# Patient Record
Sex: Male | Born: 1990 | Race: Black or African American | Hispanic: No
Health system: Western US, Academic
[De-identification: ages and names within clinical notes are randomized; demographics above are authoritative.]

## PROBLEM LIST (undated history)

## (undated) ENCOUNTER — Emergency Department (HOSPITAL_COMMUNITY): Discharge status: Against Medical Advice | Payer: Self-pay

## (undated) DEATH — deceased

---
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
## 2014-04-04 ENCOUNTER — Ambulatory Visit (INDEPENDENT_AMBULATORY_CARE_PROVIDER_SITE_OTHER): Payer: Self-pay

## 2014-04-04 ENCOUNTER — Encounter: Payer: Self-pay | Admitting: Internal Medicine

## 2014-04-04 DIAGNOSIS — Z113 Encounter for screening for infections with a predominantly sexual mode of transmission: Secondary | ICD-10-CM

## 2014-04-04 DIAGNOSIS — Z79899 Other long term (current) drug therapy: Secondary | ICD-10-CM

## 2014-04-04 DIAGNOSIS — B20 Human immunodeficiency virus [HIV] disease: Secondary | ICD-10-CM

## 2014-04-04 LAB — CBC WITH DIFFERENTIAL/PLATELET
Basophils Absolute: 0 10*3/uL (ref 0.0–0.1)
Basophils Relative: 0 % (ref 0–1)
Eosinophils Absolute: 0 10*3/uL (ref 0.0–0.7)
Eosinophils Relative: 1 % (ref 0–5)
HCT: 40 % (ref 39.0–52.0)
Hemoglobin: 13.7 g/dL (ref 13.0–17.0)
Lymphocytes Relative: 32 % (ref 12–46)
Lymphs Abs: 1.2 10*3/uL (ref 0.7–4.0)
MCH: 28 pg (ref 26.0–34.0)
MCHC: 34.3 g/dL (ref 30.0–36.0)
MCV: 81.6 fL (ref 78.0–100.0)
MPV: 9.6 fL (ref 8.6–12.4)
Monocytes Absolute: 0.4 10*3/uL (ref 0.1–1.0)
Monocytes Relative: 10 % (ref 3–12)
Neutro Abs: 2.2 10*3/uL (ref 1.7–7.7)
Neutrophils Relative %: 57 % (ref 43–77)
Platelets: 193 10*3/uL (ref 150–400)
RBC: 4.9 MIL/uL (ref 4.22–5.81)
RDW: 14.8 % (ref 11.5–15.5)
WBC: 3.9 10*3/uL — ABNORMAL LOW (ref 4.0–10.5)

## 2014-04-05 LAB — COMPLETE METABOLIC PANEL WITH GFR
ALT: 9 U/L (ref 0–53)
AST: 17 U/L (ref 0–37)
Albumin: 4.1 g/dL (ref 3.5–5.2)
Alkaline Phosphatase: 68 U/L (ref 39–117)
BUN: 7 mg/dL (ref 6–23)
CO2: 31 mEq/L (ref 19–32)
Calcium: 9.3 mg/dL (ref 8.4–10.5)
Chloride: 103 mEq/L (ref 96–112)
Creat: 0.83 mg/dL (ref 0.50–1.35)
GFR, Est African American: >89 mL/min
GFR, Est Non African American: >89 mL/min
Glucose, Bld: 99 mg/dL (ref 70–99)
Potassium: 4.3 mEq/L (ref 3.5–5.3)
Sodium: 138 mEq/L (ref 135–145)
Total Bilirubin: 0.4 mg/dL (ref 0.2–1.2)
Total Protein: 8.7 g/dL — ABNORMAL HIGH (ref 6.0–8.3)

## 2014-04-05 LAB — T-HELPER CELL (CD4) - (RCID CLINIC ONLY)
CD4 % Helper T Cell: 17 % — ABNORMAL LOW (ref 33–55)
CD4 T CELL ABS: 200 /uL — AB (ref 400–2700)

## 2014-04-05 LAB — LIPID PANEL
Cholesterol: 130 mg/dL (ref 0–200)
HDL: 48 mg/dL (ref >=40)
LDL CALC: 71 mg/dL (ref 0–99)
TRIGLYCERIDES: 54 mg/dL (ref <150)
Total CHOL/HDL Ratio: 2.7 Ratio
VLDL: 11 mg/dL (ref 0–40)

## 2014-04-05 LAB — URINALYSIS
Bilirubin Urine: NEGATIVE
Glucose, UA: NEGATIVE mg/dL
Hgb urine dipstick: NEGATIVE
KETONES UR: NEGATIVE mg/dL
LEUKOCYTES UA: NEGATIVE
NITRITE: NEGATIVE
PH: 6.5 (ref 5.0–8.0)
Protein, ur: NEGATIVE mg/dL
Specific Gravity, Urine: 1.021 (ref 1.005–1.030)
UROBILINOGEN UA: 0.2 mg/dL (ref 0.0–1.0)

## 2014-04-05 LAB — URINE CYTOLOGY ANCILLARY ONLY
Chlamydia: NEGATIVE
Neisseria Gonorrhea: NEGATIVE

## 2014-04-05 LAB — HEPATITIS B SURFACE ANTIGEN: Hepatitis B Surface Ag: NEGATIVE

## 2014-04-05 LAB — HEPATITIS A ANTIBODY, TOTAL: HEP A TOTAL AB: NONREACTIVE

## 2014-04-05 LAB — HEPATITIS B CORE ANTIBODY, TOTAL: Hep B Core Total Ab: NONREACTIVE

## 2014-04-05 LAB — HEPATITIS B SURFACE ANTIBODY,QUALITATIVE: Hep B S Ab: POSITIVE — AB

## 2014-04-05 LAB — RPR

## 2014-04-05 LAB — HEPATITIS C ANTIBODY: HCV AB: NEGATIVE

## 2014-04-06 LAB — HIV-1 RNA ULTRAQUANT REFLEX TO GENTYP+
HIV 1 RNA Quant: 34191 copies/mL — ABNORMAL HIGH (ref <20)
HIV-1 RNA QUANT, LOG: 4.53 {Log} — AB (ref <1.30)

## 2014-04-06 NOTE — Progress Notes (Signed)
Patient is new today for an HIV intake, diagnosed on 03/16/2014 at [facility]. Patient expressed that he is trying very hard to "wrap his head around his diagnosis". He states that physically he feels fine, but emotionally he is sad and has a lot of worries about the future. I advised him that we have counseling here, and he could start when he felt up to it. He is in a relationship of 2 years and his partner is negative. Patient refused vaccines today, but states he may get them when he returns in 2 weeks. Next appointment is on 04/19/2014 Pearson Grippeamika [place]arborough,CMA, BS

## 2014-04-07 LAB — QUANTIFERON TB GOLD ASSAY (BLOOD)
Interferon Gamma Release Assay: NEGATIVE
MITOGEN VALUE: 0.85 [IU]/mL
QUANTIFERON NIL VALUE: 0.06 [IU]/mL
Quantiferon Tb Ag Minus Nil Value: 0 IU/mL
TB Ag value: 0.05 IU/mL

## 2014-04-09 LAB — HLA B*5701: HLA-B 5701 W/RFLX HLA-B HIGH: NEGATIVE

## 2014-04-15 LAB — HIV-1 GENOTYPR PLUS

## 2014-04-19 ENCOUNTER — Ambulatory Visit (INDEPENDENT_AMBULATORY_CARE_PROVIDER_SITE_OTHER): Payer: Self-pay | Admitting: Internal Medicine

## 2014-04-19 ENCOUNTER — Encounter: Payer: Self-pay | Admitting: Internal Medicine

## 2014-04-19 VITALS — BP 124/78 | HR 79 | Temp 98.8°F | Ht 71.0 in | Wt 158.0 lb

## 2014-04-19 DIAGNOSIS — B20 Human immunodeficiency virus [HIV] disease: Secondary | ICD-10-CM | POA: Insufficient documentation

## 2014-04-19 DIAGNOSIS — Z23 Encounter for immunization: Secondary | ICD-10-CM

## 2014-04-19 MED ORDER — ELVITEG-COBIC-EMTRICIT-TENOFAF 150-150-200-10 MG PO TABS: 1.0000 | ORAL_TABLET | Freq: Every day | ORAL | Status: DC

## 2014-04-19 NOTE — Addendum Note (Signed)
Addended by: Wendall MolaOCKERHAM, JACQUELINE A on: 04/19/2014 05:00 PM   Modules accepted: Orders

## 2014-04-19 NOTE — Progress Notes (Signed)
Patient ID: Ronald Gaines, male    DOB: 11/28/1990, 24 y.o.   MRN: [id number]  HPI:   He comes in for his first visit for HIV.  Diagnosed at [facility].  Has been with same partner for 2 years.  Adjusting to diagnosis.    History reviewed. No pertinent past medical history.  Prior to Admission medications   Not on File    No Known Allergies  History  Substance Use Topics  . Smoking status: Never Smoker   . Smokeless tobacco: Not on file  . Alcohol Use: 0.0 oz/week    0 Standard drinks or equivalent per week     Comment: socially    History reviewed. No pertinent family history.  Review of Systems A comprehensive review of systems was negative.   There were no vitals filed for this visit. in no apparent distress and alert HEENT: anicteric Cor RRR and No murmurs clear Bowel sounds are normal, liver is not enlarged, spleen is not enlarged peripheral pulses normal, no pedal edema, no clubbing or cyanosis negative for - jaundice, spider hemangioma, telangiectasia, palmar erythema, ecchymosis and atrophy  Lab Results  Component Value Date   HIV1RNAQUANT 34191* 04/04/2014   No components found for: HIV1GENOTYPRPLUS No components found for: THELPERCELL  Assessment: new HIV patient.  Discussed treatment options, long term benefits, mechanism of resistance.  Need to take consistently.    Plan: 1) Start Genvoya once established with drug assistance.  2) Follow up in 4-5 weeks on treatment 3) offered counseling but patient refused

## 2014-04-20 ENCOUNTER — Other Ambulatory Visit: Payer: Self-pay | Admitting: Licensed Clinical Social Worker

## 2014-04-20 MED ORDER — ELVITEG-COBIC-EMTRICIT-TENOFAF 150-150-200-10 MG PO TABS
1.0000 | ORAL_TABLET | Freq: Every day | ORAL | Status: DC
Start: 2014-04-20 — End: 2014-05-04

## 2014-05-04 MED ORDER — ELVITEG-COBIC-EMTRICIT-TENOFAF 150-150-200-10 MG PO TABS: 1.0000 | ORAL_TABLET | Freq: Every day | ORAL | Status: DC

## 2014-05-12 ENCOUNTER — Telehealth: Payer: Self-pay | Admitting: Licensed Clinical Social Worker

## 2014-05-12 NOTE — Telephone Encounter (Signed)
Patient had appointments scheduled for May but did not start medications yet. I pushed the lab visit and office visit back to mid June. Patient will call back when he start medications.

## 2014-05-17 ENCOUNTER — Other Ambulatory Visit: Payer: Self-pay

## 2014-06-02 ENCOUNTER — Ambulatory Visit: Payer: Self-pay | Admitting: Internal Medicine

## 2014-06-20 ENCOUNTER — Telehealth: Payer: Self-pay | Admitting: *Deleted

## 2014-06-20 ENCOUNTER — Encounter: Payer: Self-pay | Admitting: *Deleted

## 2014-06-20 NOTE — Telephone Encounter (Signed)
Needing to drop off FMLA paperwork to be completed by MD.  Pt requested if he could "[facility] off" the [facility].  RN left message for the pt that "yes" he could "drop off" the paperwork.  He would need to allow 7 working days for the MD to complete it.

## 2014-06-27 ENCOUNTER — Other Ambulatory Visit: Payer: Self-pay

## 2014-06-27 ENCOUNTER — Telehealth: Payer: Self-pay | Admitting: Licensed Clinical Social Worker

## 2014-06-27 NOTE — Telephone Encounter (Signed)
Patient called because he has not received a letter that he was approved for ADAP, I called [facility] and it is approved. Notified the patient that he can go today to get his medication. Also I cancelled his lab appointment for today and his office visit for 2 weeks. Patient will call back to let us know what day he started medications so we can reschedule labs 4 weeks after and office visit 2 weeks later.

## 2014-07-07 ENCOUNTER — Ambulatory Visit: Payer: Self-pay | Admitting: Internal Medicine

## 2014-07-19 ENCOUNTER — Telehealth: Payer: Self-pay | Admitting: *Deleted

## 2014-07-19 NOTE — Telephone Encounter (Signed)
Notified patient that per Dr. Luciana Axeomer he can not fill out the  Physical assessment form from [employer]. He needs to give to PCP and if he does not have one we can provide some names. He does have insurance. He said he needs the form to be able to come to appts; but the form he left is not FMLA it is a physical assessment. If he wants FMLA filled out he will need an appt with Dr. Luciana Axeomer. Wendall MolaJacqueline Cockerham

## 2014-07-19 NOTE — Telephone Encounter (Signed)
Paperwork up front in folder to return to patient.

## 2014-08-16 ENCOUNTER — Other Ambulatory Visit: Payer: Self-pay

## 2014-09-05 ENCOUNTER — Other Ambulatory Visit (INDEPENDENT_AMBULATORY_CARE_PROVIDER_SITE_OTHER): Payer: Self-pay

## 2014-09-05 DIAGNOSIS — B20 Human immunodeficiency virus [HIV] disease: Secondary | ICD-10-CM

## 2014-09-05 LAB — CBC WITH DIFFERENTIAL/PLATELET
BASOS ABS: 0 10*3/uL (ref 0.0–0.1)
BASOS PCT: 0 % (ref 0–1)
Eosinophils Absolute: 0 10*3/uL (ref 0.0–0.7)
Eosinophils Relative: 1 % (ref 0–5)
HCT: 41.3 % (ref 39.0–52.0)
Hemoglobin: 14.4 g/dL (ref 13.0–17.0)
Lymphocytes Relative: 46 % (ref 12–46)
Lymphs Abs: 1.5 10*3/uL (ref 0.7–4.0)
MCH: 28.2 pg (ref 26.0–34.0)
MCHC: 34.9 g/dL (ref 30.0–36.0)
MCV: 80.8 fL (ref 78.0–100.0)
MONOS PCT: 14 % — AB (ref 3–12)
MPV: 10.3 fL (ref 8.6–12.4)
Monocytes Absolute: 0.5 10*3/uL (ref 0.1–1.0)
NEUTROS PCT: 39 % — AB (ref 43–77)
Neutro Abs: 1.3 10*3/uL — ABNORMAL LOW (ref 1.7–7.7)
Platelets: 157 10*3/uL (ref 150–400)
RBC: 5.11 MIL/uL (ref 4.22–5.81)
RDW: 15.6 % — ABNORMAL HIGH (ref 11.5–15.5)
WBC: 3.3 10*3/uL — ABNORMAL LOW (ref 4.0–10.5)

## 2014-09-05 LAB — COMPLETE METABOLIC PANEL WITH GFR
ALT: 9 U/L (ref 9–46)
AST: 14 U/L (ref 10–40)
Albumin: 3.9 g/dL (ref 3.6–5.1)
Alkaline Phosphatase: 66 U/L (ref 40–115)
BUN: 10 mg/dL (ref 7–25)
CALCIUM: 9 mg/dL (ref 8.6–10.3)
CHLORIDE: 101 mmol/L (ref 98–110)
CO2: 26 mmol/L (ref 20–31)
CREATININE: 0.98 mg/dL (ref 0.60–1.35)
Glucose, Bld: 101 mg/dL — ABNORMAL HIGH (ref 65–99)
POTASSIUM: 4 mmol/L (ref 3.5–5.3)
Sodium: 139 mmol/L (ref 135–146)
Total Bilirubin: 0.8 mg/dL (ref 0.2–1.2)
Total Protein: 8.5 g/dL — ABNORMAL HIGH (ref 6.1–8.1)

## 2014-09-06 LAB — HIV-1 RNA QUANT-NO REFLEX-BLD
HIV 1 RNA Quant: <20 copies/mL (ref <20)
HIV-1 RNA Quant, Log: <1.3 {Log} (ref <1.30)

## 2014-09-06 LAB — T-HELPER CELL (CD4) - (RCID CLINIC ONLY)
CD4 % Helper T Cell: 17 % — ABNORMAL LOW (ref 33–55)
CD4 T Cell Abs: 250 /uL — ABNORMAL LOW (ref 400–2700)

## 2014-09-22 ENCOUNTER — Ambulatory Visit (INDEPENDENT_AMBULATORY_CARE_PROVIDER_SITE_OTHER): Payer: Self-pay | Admitting: Internal Medicine

## 2014-09-22 ENCOUNTER — Encounter: Payer: Self-pay | Admitting: Internal Medicine

## 2014-09-22 VITALS — BP 130/70 | HR 74 | Temp 98.1°F | Wt 166.0 lb

## 2014-09-22 DIAGNOSIS — F329 Major depressive disorder, single episode, unspecified: Secondary | ICD-10-CM

## 2014-09-22 DIAGNOSIS — B20 Human immunodeficiency virus [HIV] disease: Secondary | ICD-10-CM

## 2014-09-22 DIAGNOSIS — F32A Depression, unspecified: Secondary | ICD-10-CM

## 2014-09-22 DIAGNOSIS — Z23 Encounter for immunization: Secondary | ICD-10-CM

## 2014-09-22 DIAGNOSIS — Z113 Encounter for screening for infections with a predominantly sexual mode of transmission: Secondary | ICD-10-CM | POA: Insufficient documentation

## 2014-09-22 NOTE — Assessment & Plan Note (Signed)
No si, doing ok and doesn't want any counseling.  I again encouraged it.  He is willing to see counselor from [employer].

## 2014-09-22 NOTE — Progress Notes (Signed)
   Subjective:    Patient ID: Ronald Gaines, male    DOB: [birth year], 24 y.o.   MRN: [id number]  HPI Here for follow up of HIV.  Diagnosed earlier this year and after some delay in getting the paperwork started [place].  Takes daily and no missed doses, though does take at different times of day.  No side effects.  Still adjusting to diagnosis and no support.  Not interested in counseling.  Uses condoms.     Review of Systems  Constitutional: Negative for fatigue.  Gastrointestinal: Negative for nausea and diarrhea.  Skin: Negative for rash.  Neurological: Negative for dizziness and light-headedness.       Objective:   Physical Exam  Constitutional: He appears well-developed and well-nourished. No distress.  Eyes: No scleral icterus.  Cardiovascular: Normal rate, regular rhythm and normal heart sounds.   No murmur heard. Pulmonary/Chest: Effort normal and breath sounds normal. No respiratory distress.  Lymphadenopathy:    He has no cervical adenopathy.  Skin: No rash noted.          Assessment & Plan:

## 2014-09-22 NOTE — Assessment & Plan Note (Signed)
Doing well.  rtc 3 months and then can space out fu.

## 2014-10-11 ENCOUNTER — Other Ambulatory Visit: Payer: Self-pay | Admitting: Infectious Disease

## 2014-10-11 DIAGNOSIS — B20 Human immunodeficiency virus [HIV] disease: Secondary | ICD-10-CM

## 2014-10-11 MED ORDER — ELVITEG-COBIC-EMTRICIT-TENOFAF 150-150-200-10 MG PO TABS: 1.0000 | ORAL_TABLET | Freq: Every day | ORAL | Status: DC

## 2014-12-05 ENCOUNTER — Other Ambulatory Visit (INDEPENDENT_AMBULATORY_CARE_PROVIDER_SITE_OTHER): Payer: Self-pay

## 2014-12-05 DIAGNOSIS — B20 Human immunodeficiency virus [HIV] disease: Secondary | ICD-10-CM

## 2014-12-05 DIAGNOSIS — Z113 Encounter for screening for infections with a predominantly sexual mode of transmission: Secondary | ICD-10-CM

## 2014-12-05 LAB — RPR

## 2014-12-06 LAB — T-HELPER CELL (CD4) - (RCID CLINIC ONLY)
CD4 % Helper T Cell: 18 % — ABNORMAL LOW (ref 33–55)
CD4 T Cell Abs: 280 /uL — ABNORMAL LOW (ref 400–2700)

## 2014-12-07 LAB — HIV-1 RNA QUANT-NO REFLEX-BLD: HIV-1 RNA Quant, Log: <1.3 Log copies/mL (ref <1.30)

## 2014-12-22 ENCOUNTER — Ambulatory Visit: Payer: Self-pay | Admitting: Internal Medicine

## 2015-03-16 ENCOUNTER — Ambulatory Visit: Payer: Self-pay | Admitting: Internal Medicine

## 2015-03-16 ENCOUNTER — Other Ambulatory Visit: Payer: Self-pay | Admitting: *Deleted

## 2015-03-16 DIAGNOSIS — B20 Human immunodeficiency virus [HIV] disease: Secondary | ICD-10-CM

## 2015-03-22 ENCOUNTER — Other Ambulatory Visit: Payer: Self-pay

## 2015-05-02 ENCOUNTER — Other Ambulatory Visit: Payer: Self-pay | Admitting: *Deleted

## 2015-05-02 DIAGNOSIS — B20 Human immunodeficiency virus [HIV] disease: Secondary | ICD-10-CM

## 2015-05-02 MED ORDER — ELVITEG-COBIC-EMTRICIT-TENOFAF 150-150-200-10 MG PO TABS: 1.0000 | ORAL_TABLET | Freq: Every day | ORAL | Status: DC

## 2015-05-03 ENCOUNTER — Telehealth: Payer: Self-pay | Admitting: Internal Medicine

## 2015-05-03 NOTE — Telephone Encounter (Signed)
Patient left a vm on medication assistance line on Tuesday (4/18) checking the status of his genvoya refill. Looks like it was sent to the pharmacy yesterday. Adap has been approved. Called patient to be sure he was able to get his medication, patient's vm has not been set up so could not leave a message.

## 2015-05-08 ENCOUNTER — Other Ambulatory Visit: Payer: Self-pay | Admitting: *Deleted

## 2015-05-08 DIAGNOSIS — B20 Human immunodeficiency virus [HIV] disease: Secondary | ICD-10-CM

## 2015-05-08 MED ORDER — ELVITEG-COBIC-EMTRICIT-TENOFAF 150-150-200-10 MG PO TABS: 1.0000 | ORAL_TABLET | Freq: Every day | ORAL | Status: DC

## 2015-05-16 ENCOUNTER — Ambulatory Visit: Payer: Self-pay | Admitting: Internal Medicine

## 2015-05-22 ENCOUNTER — Encounter: Payer: Self-pay | Admitting: Internal Medicine

## 2015-05-22 ENCOUNTER — Ambulatory Visit (INDEPENDENT_AMBULATORY_CARE_PROVIDER_SITE_OTHER): Payer: Self-pay | Admitting: Internal Medicine

## 2015-05-22 VITALS — BP 124/74 | HR 79 | Temp 98.3°F | Ht 71.0 in | Wt 160.0 lb

## 2015-05-22 DIAGNOSIS — Z113 Encounter for screening for infections with a predominantly sexual mode of transmission: Secondary | ICD-10-CM

## 2015-05-22 DIAGNOSIS — F329 Major depressive disorder, single episode, unspecified: Secondary | ICD-10-CM

## 2015-05-22 DIAGNOSIS — Z79899 Other long term (current) drug therapy: Secondary | ICD-10-CM | POA: Insufficient documentation

## 2015-05-22 DIAGNOSIS — F32A Depression, unspecified: Secondary | ICD-10-CM

## 2015-05-22 DIAGNOSIS — B20 Human immunodeficiency virus [HIV] disease: Secondary | ICD-10-CM

## 2015-05-22 LAB — CBC WITH DIFFERENTIAL/PLATELET
BASOS ABS: 0 {cells}/uL (ref 0–200)
Basophils Relative: 0 %
EOS PCT: 2 %
Eosinophils Absolute: 70 cells/uL (ref 15–500)
HCT: 39.1 % (ref 38.5–50.0)
HEMOGLOBIN: 13.2 g/dL (ref 13.2–17.1)
LYMPHS ABS: 1645 {cells}/uL (ref 850–3900)
Lymphocytes Relative: 47 %
MCH: 28.8 pg (ref 27.0–33.0)
MCHC: 33.8 g/dL (ref 32.0–36.0)
MCV: 85.4 fL (ref 80.0–100.0)
MPV: 9.8 fL (ref 7.5–12.5)
Monocytes Absolute: 420 cells/uL (ref 200–950)
Monocytes Relative: 12 %
NEUTROS ABS: 1365 {cells}/uL — AB (ref 1500–7800)
Neutrophils Relative %: 39 %
Platelets: 191 10*3/uL (ref 140–400)
RBC: 4.58 MIL/uL (ref 4.20–5.80)
RDW: 15.2 % — ABNORMAL HIGH (ref 11.0–15.0)
WBC: 3.5 10*3/uL — ABNORMAL LOW (ref 3.8–10.8)

## 2015-05-22 NOTE — Progress Notes (Signed)
   Subjective:    Patient ID: Ronald Gaines, male    DOB: 01/07/1991, 25 y.o.   MRN: [id number]  HPI Here for follow up of HIV.   Diagnosed last year and after some delay in getting the paperwork started Genvoya.  Takes daily and no missed doses. Had labs about 6 months ago and was undetectable again.  No side effects.  Still adjusting to diagnosis and no support. Uses condoms.  No weight loss, no diarrhea.     Review of Systems  Constitutional: Negative for fatigue.  Gastrointestinal: Negative for nausea and diarrhea.  Skin: Negative for rash.  Neurological: Negative for dizziness and light-headedness.       Objective:   Physical Exam  Constitutional: He appears well-developed and well-nourished. No distress.  Eyes: No scleral icterus.  Cardiovascular: Normal rate, regular rhythm and normal heart sounds.   No murmur heard. Pulmonary/Chest: Effort normal and breath sounds normal. No respiratory distress.  Lymphadenopathy:    He has no cervical adenopathy.  Skin: No rash noted.          Assessment & Plan:

## 2015-05-22 NOTE — Assessment & Plan Note (Signed)
Doing well.  Labs today and rtc 3 months for visit, labs and ADAP renewal in August since he comes from [place].

## 2015-05-22 NOTE — Assessment & Plan Note (Signed)
Is stable.  No SI and adjusting to diagnosis.

## 2015-05-23 LAB — COMPLETE METABOLIC PANEL WITH GFR
ALBUMIN: 3.9 g/dL (ref 3.6–5.1)
ALK PHOS: 84 U/L (ref 40–115)
ALT: 12 U/L (ref 9–46)
AST: 15 U/L (ref 10–40)
BUN: 11 mg/dL (ref 7–25)
CO2: 28 mmol/L (ref 20–31)
Calcium: 9.2 mg/dL (ref 8.6–10.3)
Chloride: 105 mmol/L (ref 98–110)
Creat: 1.11 mg/dL (ref 0.60–1.35)
GFR, Est African American: >89 mL/min (ref >=60)
GLUCOSE: 76 mg/dL (ref 65–99)
Potassium: 4.6 mmol/L (ref 3.5–5.3)
SODIUM: 140 mmol/L (ref 135–146)
Total Bilirubin: 0.3 mg/dL (ref 0.2–1.2)
Total Protein: 7.6 g/dL (ref 6.1–8.1)

## 2015-05-23 LAB — LIPID PANEL
CHOL/HDL RATIO: 2.2 ratio (ref <=5.0)
CHOLESTEROL: 139 mg/dL (ref 125–200)
HDL: 64 mg/dL (ref >=40)
LDL Cholesterol: 57 mg/dL (ref <130)
TRIGLYCERIDES: 89 mg/dL (ref <150)
VLDL: 18 mg/dL (ref <30)

## 2015-05-23 LAB — HIV-1 RNA QUANT-NO REFLEX-BLD
HIV 1 RNA QUANT: 53 {copies}/mL — AB (ref <20)
HIV-1 RNA QUANT, LOG: 1.72 {Log_copies}/mL — AB (ref <1.30)

## 2015-05-23 LAB — RPR

## 2015-05-24 LAB — URINE CYTOLOGY ANCILLARY ONLY
CHLAMYDIA, DNA PROBE: NEGATIVE
NEISSERIA GONORRHEA: NEGATIVE

## 2015-05-24 LAB — T-HELPER CELL (CD4) - (RCID CLINIC ONLY)
CD4 % Helper T Cell: 20 % — ABNORMAL LOW (ref 33–55)
CD4 T Cell Abs: 350 /uL — ABNORMAL LOW (ref 400–2700)

## 2015-06-28 ENCOUNTER — Other Ambulatory Visit: Payer: Self-pay | Admitting: Internal Medicine

## 2015-06-28 DIAGNOSIS — B20 Human immunodeficiency virus [HIV] disease: Secondary | ICD-10-CM

## 2015-08-29 ENCOUNTER — Other Ambulatory Visit (INDEPENDENT_AMBULATORY_CARE_PROVIDER_SITE_OTHER): Payer: Self-pay

## 2015-08-29 ENCOUNTER — Ambulatory Visit: Payer: Self-pay | Admitting: Internal Medicine

## 2015-08-29 ENCOUNTER — Ambulatory Visit: Payer: Self-pay

## 2015-08-29 DIAGNOSIS — B20 Human immunodeficiency virus [HIV] disease: Secondary | ICD-10-CM

## 2015-08-30 LAB — T-HELPER CELL (CD4) - (RCID CLINIC ONLY)
CD4 % Helper T Cell: 21 % — ABNORMAL LOW (ref 33–55)
CD4 T CELL ABS: 290 /uL — AB (ref 400–2700)

## 2015-08-31 LAB — HIV-1 RNA QUANT-NO REFLEX-BLD
HIV 1 RNA Quant: <20 copies/mL (ref <20)
HIV-1 RNA Quant, Log: <1.3 Log copies/mL (ref <1.30)

## 2015-09-04 ENCOUNTER — Ambulatory Visit: Payer: Self-pay | Admitting: Internal Medicine

## 2015-09-06 ENCOUNTER — Encounter: Payer: Self-pay | Admitting: Internal Medicine

## 2015-09-20 ENCOUNTER — Ambulatory Visit (INDEPENDENT_AMBULATORY_CARE_PROVIDER_SITE_OTHER): Payer: Self-pay | Admitting: Internal Medicine

## 2015-09-20 ENCOUNTER — Encounter: Payer: Self-pay | Admitting: Internal Medicine

## 2015-09-20 VITALS — BP 113/68 | HR 90 | Temp 99.4°F | Wt 162.0 lb

## 2015-09-20 DIAGNOSIS — F329 Major depressive disorder, single episode, unspecified: Secondary | ICD-10-CM

## 2015-09-20 DIAGNOSIS — B20 Human immunodeficiency virus [HIV] disease: Secondary | ICD-10-CM

## 2015-09-20 DIAGNOSIS — Z113 Encounter for screening for infections with a predominantly sexual mode of transmission: Secondary | ICD-10-CM

## 2015-09-20 DIAGNOSIS — Z79899 Other long term (current) drug therapy: Secondary | ICD-10-CM

## 2015-09-20 DIAGNOSIS — F32A Depression, unspecified: Secondary | ICD-10-CM

## 2015-09-20 NOTE — Assessment & Plan Note (Signed)
Will recheck rpr next visit.  Was 1:64 and treated.  Has condoms and has only had recent oral sex though I explained transmisison can come from multiple different encounters.

## 2015-09-20 NOTE — Assessment & Plan Note (Signed)
Still depressed but does not want any help.  No SI.

## 2015-09-20 NOTE — Progress Notes (Signed)
   Subjective:    Patient ID: Ronald Gaines, male    DOB: 09/18/1990, 25 y.o.   MRN: [id number]  HPI Here for follow up of HIV.   Has been on Genvoya since diagnosis and no issues with it.  Takes daily and no missed doses. Has been suppressed for about 1 year and CD 4 290.  No side effects. Uses condoms but recently diagnosed with syphilis.  Record from UC in [place] shows RPR 1:64.  He received IM penicillin then.  Previously negative in May 2017.  No weight loss, no diarrhea.     Review of Systems  Constitutional: Negative for fatigue.  Gastrointestinal: Negative for diarrhea and nausea.  Skin: Negative for rash.  Neurological: Negative for dizziness and light-headedness.       Objective:   Physical Exam  Constitutional: He appears well-developed and well-nourished. No distress.  Eyes: No scleral icterus.  Cardiovascular: Normal rate, regular rhythm and normal heart sounds.   No murmur heard. Pulmonary/Chest: Effort normal and breath sounds normal. No respiratory distress.  Lymphadenopathy:    He has no cervical adenopathy.  Skin: No rash noted.    SHx: recent oral sex, unprotected.        Assessment & Plan:

## 2015-09-20 NOTE — Assessment & Plan Note (Signed)
Doing well on his regimen and will continue.  RTC 6 months.  Requesting I fill out FMLA-like paper from his employer and will have it sent to me.

## 2015-09-27 ENCOUNTER — Telehealth: Payer: Self-pay | Admitting: *Deleted

## 2015-09-27 NOTE — Telephone Encounter (Signed)
Patient asking for paperwork regarding work accommodations that was discussed 9/6 office visit.  No letter in chart. Please advise.  Andree CossHowell, Michelle M, RN

## 2015-09-28 ENCOUNTER — Encounter: Payer: Self-pay | Admitting: *Deleted

## 2015-09-28 ENCOUNTER — Telehealth: Payer: Self-pay | Admitting: *Deleted

## 2015-09-28 NOTE — Telephone Encounter (Signed)
FMLA faxed to [employer], attn Gearldine BienenstockBennie Wilkins at [phone]. Patient notified and copy placed upfront for scan. Wendall MolaJacqueline Cockerham

## 2015-09-28 NOTE — Telephone Encounter (Signed)
Patient notified and he has spoke to his employer and the form is suppose to be faxed. Wendall MolaJacqueline Cockerham

## 2015-09-28 NOTE — Telephone Encounter (Signed)
Forms received in Triage today, placed in Dr. Ephriam Knucklesomer's pod in-basket.

## 2015-09-28 NOTE — Telephone Encounter (Signed)
I have not yet received any paperwork from the employer.  I will be happy to fill it out once received. thanks

## 2015-10-24 ENCOUNTER — Other Ambulatory Visit: Payer: Self-pay | Admitting: Internal Medicine

## 2015-10-24 DIAGNOSIS — B20 Human immunodeficiency virus [HIV] disease: Secondary | ICD-10-CM

## 2015-11-08 ENCOUNTER — Telehealth: Payer: Self-pay | Admitting: *Deleted

## 2015-11-08 NOTE — Telephone Encounter (Signed)
Received signed Release of Information from [facility] asking for last 2 years of HIV records. RN contacted patient to confirm that he is transferring all of his care, including HIV. Per patient, he is.  Will notify [facility], will cancel upcoming appointments. Patient's records faxed as requested. Andree CossHowell, Michelle M, RN

## 2016-01-30 ENCOUNTER — Ambulatory Visit: Payer: BLUE CROSS/BLUE SHIELD

## 2016-01-30 ENCOUNTER — Telehealth (HOSPITAL_BASED_OUTPATIENT_CLINIC_OR_DEPARTMENT_OTHER): Payer: Self-pay

## 2016-01-30 DIAGNOSIS — A549 Gonococcal infection, unspecified: Secondary | ICD-10-CM

## 2016-01-30 NOTE — Telephone Encounter (Signed)
(  TEXTING IS AN OPTION FOR [facility] CLINICS ONLY)  Is this a [facility] clinic? No      RETURN CALL: Detailed message on voicemail only      SUBJECT:  General Message     REASON FOR REQUEST: patient calling to enroll in the [facility] if available.  Patient just moved here and would like to make a post exposure appointment please call  Thank you    MESSAGE: see above

## 2016-02-08 ENCOUNTER — Encounter (HOSPITAL_BASED_OUTPATIENT_CLINIC_OR_DEPARTMENT_OTHER): Payer: Self-pay | Admitting: Counselor

## 2016-02-08 ENCOUNTER — Ambulatory Visit (HOSPITAL_BASED_OUTPATIENT_CLINIC_OR_DEPARTMENT_OTHER): Payer: BLUE CROSS/BLUE SHIELD | Attending: Internal Medicine | Admitting: Internal Medicine

## 2016-02-08 DIAGNOSIS — Z6822 Body mass index (BMI) 22.0-22.9, adult: Secondary | ICD-10-CM

## 2016-02-08 DIAGNOSIS — Z23 Encounter for immunization: Secondary | ICD-10-CM | POA: Insufficient documentation

## 2016-02-08 DIAGNOSIS — J069 Acute upper respiratory infection, unspecified: Secondary | ICD-10-CM

## 2016-02-08 DIAGNOSIS — B20 Human immunodeficiency virus [HIV] disease: Secondary | ICD-10-CM | POA: Insufficient documentation

## 2016-02-08 LAB — CBC, DIFF
% Basophils: 0 %
% Eosinophils: 2 %
% Immature Granulocytes: 0 %
% Lymphocytes: 24 %
% Monocytes: 7 %
% Neutrophils: 67 %
Absolute Eosinophil Count: 0.1 10*3/uL (ref 0.00–0.50)
Absolute Lymphocyte Count: 1.21 10*3/uL (ref 1.00–4.80)
Basophils: 0.01 10*3/uL (ref 0.00–0.20)
Hematocrit: 37 % — ABNORMAL LOW (ref 38–50)
Hemoglobin: 12.8 g/dL — ABNORMAL LOW (ref 13.0–18.0)
Immature Granulocytes: 0.01 10*3/uL (ref 0.00–0.05)
MCH: 28.6 pg (ref 27.3–33.6)
MCHC: 34.8 g/dL (ref 32.2–36.5)
MCV: 82 fL (ref 81–98)
Monocytes: 0.37 10*3/uL (ref 0.00–0.80)
Neutrophils: 3.3 10*3/uL (ref 1.80–7.00)
Platelet Count: 341 10*3/uL (ref 150–400)
RBC: 4.47 10*6/uL (ref 4.40–5.60)
RDW-CV: 13.8 % (ref 11.6–14.4)
WBC: 5 10*3/uL (ref 4.30–10.00)

## 2016-02-08 LAB — URINALYSIS COMPLETE, URN
Bacteria, URN: NONE SEEN
Bilirubin (Qual), URN: NEGATIVE
Epith Cells_Renal/Trans,URN: NEGATIVE /HPF
Epith Cells_Squamous, URN: NEGATIVE /LPF
Glucose Qual, URN: NEGATIVE mg/dL
Ketones, URN: NEGATIVE mg/dL
Leukocyte Esterase, URN: NEGATIVE
Nitrite, URN: NEGATIVE
Specific Gravity, URN: 1.02 g/mL (ref 1.002–1.027)
WBC, URN: NEGATIVE /HPF
pH, URN: 6 (ref 5.0–8.0)

## 2016-02-08 LAB — COMPREHENSIVE METABOLIC PANEL
ALT (GPT): 7 U/L — ABNORMAL LOW (ref 10–64)
AST (GOT): 13 U/L (ref 9–38)
Albumin: 4.5 g/dL (ref 3.5–5.2)
Alkaline Phosphatase (Total): 71 U/L (ref 35–109)
Anion Gap: 9 (ref 4–12)
Bilirubin (Total): 0.5 mg/dL (ref 0.2–1.3)
Calcium: 9.7 mg/dL (ref 8.9–10.2)
Carbon Dioxide, Total: 28 meq/L (ref 22–32)
Chloride: 100 meq/L (ref 98–108)
Creatinine: 0.96 mg/dL (ref 0.51–1.18)
GFR, Calc, African American: >60 mL/min/{1.73_m2} (ref >59)
GFR, Calc, European American: >60 mL/min/{1.73_m2} (ref >59)
Glucose: 83 mg/dL (ref 62–125)
Potassium: 3.3 meq/L — ABNORMAL LOW (ref 3.6–5.2)
Protein (Total): 8.7 g/dL — ABNORMAL HIGH (ref 6.0–8.2)
Sodium: 137 meq/L (ref 135–145)
Urea Nitrogen: 7 mg/dL — ABNORMAL LOW (ref 8–21)

## 2016-02-08 LAB — PROTHROMBIN TIME
Prothrombin INR: 1.1 (ref 0.8–1.3)
Prothrombin Time Patient: 13.9 s (ref 10.7–15.6)

## 2016-02-08 NOTE — Progress Notes (Signed)
[facility]    ID/CC: Dennis [name] is a 26 year old year old male here for routine clinic visit.      HPI:  Dennis [name] was diagnosed with HIV on March 21, 2014 in [place], [place]. His family does not know about his diagnosis, but some his friends know. Thinks that his ex-male partner gave him HIV. Does not have sex for money and does not do IV drugs. He has been on treatment with Genvoya since his diagnosis. He has not had any side effects or issues with it. Takes his pills daily and denies missing any pills. Tried to get his meds sent from [facility] to [place]. Says that he has at least 2 weeks worth of medication. Went to Urgent Care on 01/31/16 and was diagnosed with sinusitis and was given a 10 day prescription for Augmentin--started taking the pills yesterday. Had sinus pressure affecting his vision ("couldn't see straight"); rhinorrhea, and cough. Says that he feels great now--but had been taking Theraflu. Says Urgent Care put him on an inhaler (ProAir) and a nebulizer. Says he tested negative for flu. Denies: fevers, chills, nausea, vomiting, night sweats, weight loss, rash, abdominal pain. +Productive cough, but rhinorrhea has slowed down.    Last HIV labs November 2017. Has been undetecable for years. Does not know CD4.    Feels anxious from time to time--stress. Deals with this by smoking MJ. Does not want to see a counselor. Denies depression and suicidal thoughts.        Problem List:  HIV          ROS  A complete review of systems was performed and was negative except as documented in the HPI.    Medications:  Genvoya  Augmentin     No current facility-administered medications for this visit.        Allergies:  Review of patient's allergies indicates:  No Known Allergies    Social History:  Social History     Social History   . Marital status: Single     Spouse name: N/A   . Number of children: N/A   . Years of education: 1 year of college     Occupational History    Security guard     Social  History Main Topics   . Smoking status: Occasionally smokes MJ (1-2 joints/week); denies tobacco   . Smokeless tobacco: Denies   . Alcohol use Drinks every other month--3-4 per month when he does drink.   . Drug use: Denies.   . Sexual activity: Last sexually active 3 years ago. Was previously sexually active with men only.     Other Topics Concern   . Not on file     Social History Narrative   . Moved to [facility] 3 weeks ago. Came out with his best friend, whom he met through mutual friends. 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 He plans to get a license. Lives in the [facility].       Family History:  Mother: 60; no known medical problems  Father: 69; no known medical problems  Sister: 12; no known medical problems  Sister: 80; no known medical problems    Physical Exam:  Vital Signs: BP 120/86   Pulse 75   Temp 99.5 F (37.5 C) (Temporal)   Ht '5\' 11"'  (1.803 m)   Wt 161 lb (73 kg)   SpO2 99%   BMI  22.45 kg/m   Gen: tearful during exam while talking about his diagnosis  Neck: Supple, no tenderness. No cervical LAD. Thyroid normal.    Eyes: Sclera anicteric; PERRL  HENT: OP- mmm; no exudates, mild pharyngeal erythema; no maxillary or frontal sinus tenderness  CV: RRR, no m/r/g, no edema  Pulm: CTA bilaterally, normal respiratory effort  Abd: Soft, NT ND +BS   Skin: no rash; warm, dry  Psych: A & O x3; normal affect  Neuro: CN 2-12 intact; Motor 5/5 throughout.  Reflexes 2+ and symmetric.  Cerebellar function intact.    Labs:  HIV  No results found for: CD4A  No results found for: CD4P  No results found for this or any previous visit.      Toxicity  No results found for this or any previous visit.  No results found for: SODIUM, POTASSIUM, CL, CO2, BUN, CREATININE, GLUCOSE, CA, MG, P  No results found for: AST, ALT, ALK, PROTEIN, ALBUMIN  No results found for this or any previous visit.    Lipids  No results found for this or any previous  visit.      STI  No results found for this or any previous visit. No results found for this or any previous visit.  No results found for this or any previous visit.    Assessment and Plan:  26 year old man with HIV on Gevoya, new to [facility].    #HIV  No records in our system at this time. Will check preliminary HIV labs, including genotype and integrase resistance, CD4, VL, baseline labs. Would continue his Genvoya for now, though I will hold off on new prescriptions until he returns in 2 weeks. STI testing today.    #Resolving URI  Got better with symptomatic treatment, not antibiotics. Felt better before starting abx. Discontinue Amox-clav. Do not think that this is sinusitis.    HCM  Immunizations:   Flu vaccine today    Recommendations:  -Baseline HIV labs and STI testing.  -Continue Genvoya.  -Discontinue Amox-clav.  -RTC in 2 weeks.

## 2016-02-08 NOTE — Progress Notes (Signed)
I have personally discussed the case with the resident during or immediately after the patient visit including review of history, physical exam, diagnosis, and treatment plan. I agree with the assessment and plan of care.

## 2016-02-08 NOTE — Progress Notes (Signed)
VACCINE SCREENING/ORDER WHEN CONTRAINDICATIONS PRESENT    Order date: 02/08/2016  Ordering provider: Pagkas-[name]  Clinic stock used: YES   Interpreter used?: None  Vaccine information sheet(s) discussed, patient/parent/guardian verbalized understanding? YES   Vaccines given: FLU  VIS given 02/08/2016 by Lucky RathkeYordanka Kardasheva MA.      SCREENING: INACTIVATED VACCINES  NO 1. Do you have a high fever, severe cold-like symptoms or serious infection?  NO 2. Do you have any food allergies such as eggs, chicken, chicken feathers, chicken dander, or gelatin?  NO 3. Do you have any allergies to neomycin, streptomycin, or polymyxin?   NO 4. Have you had any severe reaction to a vaccine in the past such as a seizure, a convulsion from a high fever, or a paralysis problem called Guillain-Barre Syndrome?    If the patient/parent/or guardian answered "yes" to any of the above questions, consult with the provider to review the responses prior to administering vaccination. Parents with a contraindication to immunization will not receive the immunization without a specific physician order to vaccinate the patient and discussion of risk and benefits related to the contraindication.     Physician Order for Administration of Vaccine(s) When Contraindications Are Present  I have reviewed the existence in this patient's health history of possible contraindication(s) to administration of vaccine. The benefits to this patient outweigh the potential risks of immunization. Administer vaccine(s):  Lucky RathkeKardasheva, [place], 02/08/2016 3:06 PM MA.

## 2016-02-08 NOTE — Patient Instructions (Signed)
-  Keep taking Genvoya  -Stop Amox-clav antibiotics  -See you in 2 weeks

## 2016-02-09 LAB — HEPATITIS B BATTERY (HBSAG W/RFLX PCR, ANTI-HBS, ANIT-HBC)
Hepatitis B Core Ab: NONREACTIVE
Hepatitis B Surf Antibody Intl Units: 82.51 [IU]
Hepatitis B Surface Ab: REACTIVE
Hepatitis B Surface Antigen w/Reflex: NONREACTIVE

## 2016-02-09 LAB — HEPATITIS C AB WITH REFLEX PCR: Hepatitis C Antibody w/Rflx PCR: NONREACTIVE

## 2016-02-09 LAB — HEPATITIS A IMMUNE STATUS IGG: Hepatitis A IgG Antibody: REACTIVE

## 2016-02-09 LAB — GC&CHLAM NUCLEIC ACID DETECTN
Chlam Trachomatis Nucleic Acid: NEGATIVE
Chlam Trachomatis Nucleic Acid: NEGATIVE
Chlam Trachomatis Nucleic Acid: POSITIVE — AB
N.Gonorrhoeae(GC) Nucleic Acid: NEGATIVE
N.Gonorrhoeae(GC) Nucleic Acid: NEGATIVE
N.Gonorrhoeae(GC) Nucleic Acid: NEGATIVE

## 2016-02-09 LAB — SEROLOGIC SYPHILIS PANEL, SRM: Syphilis Igg Ab Result: POSITIVE — AB

## 2016-02-09 LAB — T CELL SUBSETS CD4/CD8 ONLY COUNTS
% CD4: 30 % — ABNORMAL LOW (ref 33–61)
% CD8: 38 % — ABNORMAL HIGH (ref 14–35)
Abs CD4 Lymphocyte Cnt: 0.341 10*3/uL — ABNORMAL LOW (ref 0.730–2.250)
Abs CD8 Lymphocyte Cnt: 0.434 10*3/uL (ref 0.250–1.240)
CD4/CD8 Ratio: 0.78 — ABNORMAL LOW (ref 1.00–3.78)

## 2016-02-09 LAB — SYPHILIS RESULTS CALL

## 2016-02-09 LAB — ANTI TOXOPLASMA, IGG
Anti Toxoplasma, IgG Interp: NONREACTIVE
Anti Toxoplasma, IgG Result: <3.2 [IU]/mL (ref 0.0–7.4)

## 2016-02-12 ENCOUNTER — Telehealth (HOSPITAL_BASED_OUTPATIENT_CLINIC_OR_DEPARTMENT_OTHER): Payer: Self-pay | Admitting: Internal Medicine

## 2016-02-12 LAB — QUANTIFERON TB TEST
Quantiferon TB Ag Result: 0.064 IU gamma IF/mL
Quantiferon TB Interpretation: NEGATIVE
Quantiferon TB MIT Result: 1.35 IU gamma IF/mL
Quantiferon TB Nil Result: 0.091 IU gamma IF/mL

## 2016-02-12 LAB — RPR QUANTITATIVE (SENDOUT): RPR Quantitative (Sendout): 1:32 {titer} — AB

## 2016-02-12 NOTE — Telephone Encounter (Signed)
Called patient about his results, but he did not answer the phone. Has positive rectal Chlamydia and +RPR with titers pending. Left a message stating to call the clinic.

## 2016-02-12 NOTE — Progress Notes (Signed)
DOCUMENT TYPE: Social Work - Follow-up Note    PATIENT: Dennis [name], Dennis [name]   MRN: [id number]  DOB: 12/20/1990    ENCOUNTER DATE:  02/08/2016    ASSOCIATED PROGRAM-CLINIC:  [facility]    CONTACT TYPE:  Face to Face - Office    BRIEF DESCRIPTION:  Introd/ Medical care/ Service plan/ ROI/ R&R/ Reduced fare permit/ Bus token/ DSHS    PATIENT DESCRIPTION, PRESENTING PROBLEM, PATIENT / FAMILY GOALS:  26 y/o homeless AA gay male with HIV, on HAART meds [place] ago. Pt. is taking Genvoya and has a month worth of meds. Pt. stays with is friend and looking for a job. Pt's Medicaid coverage pending. Pt. diagnosed with HIV on 03/21/2014 previous provider Dr. St[place]t [place] Pt. said he lives on saving and needs assistance with housing, food and transportation.    INTERVENTION / OUTCOME / PLAN:  Introduced to pt. and gave business card. SW discussed with pt. about the role of CM [facility]. Pt. said he moved away from his parents to [place] to experience life. He [facility] and would like to apply for housing program. He is using the money he saved for now but it does not last long. He applied for job in many places and had interviews. He hopes he can find job soon. He brought a month worth of HIV meds from [place] and he may have 2 weeks' worth of meds left. Pt. completed WAH and pending. Educated pt. about WHPF account, FC and WAH. Reminded pt. to check insurance card and assigned clinic/PCP.     Service plan completed and signed. Pt. has no car; he uses bus and provided pt. with bus token. Educated pt. about reduced fare permit, consulted medical provider with this form, signed and gave to pt. Worker gave [employer] office for food benefit and pt. to follow up. Writer explained about L[employer] housing program, assessments and low-income housing programs. Read through pt's rights and responsibilities and signed this document. Completed  release of information and signed. Provided pt. with emotional support. Will work on LDP and other needs at his next medical appt.     Plan:  1. Assist pt. to engage with medical care.  2. Assist pt. with adherence to HAART meds.   3. Assist pt. with medical insurance coverage.  4. Provide transportation/ reduced fare permit.  5. Support pt. with low-income housing.   6. Provide basic needs.    DOCUMENT COMPOSED BY:   Naomie DeanMeti Duressa, MSW [phone] on 02/12/2016

## 2016-02-12 NOTE — Telephone Encounter (Signed)
I spoke with patient and let him know his results. He will need treatment for syphilis and rectal chlamydia. I will ask his provider to put in the orders.

## 2016-02-13 ENCOUNTER — Telehealth (HOSPITAL_BASED_OUTPATIENT_CLINIC_OR_DEPARTMENT_OTHER): Payer: Self-pay | Admitting: Internal Medicine

## 2016-02-13 ENCOUNTER — Ambulatory Visit (HOSPITAL_BASED_OUTPATIENT_CLINIC_OR_DEPARTMENT_OTHER): Payer: BLUE CROSS/BLUE SHIELD | Attending: Internal Medicine

## 2016-02-13 DIAGNOSIS — A64 Unspecified sexually transmitted disease: Secondary | ICD-10-CM | POA: Insufficient documentation

## 2016-02-13 DIAGNOSIS — A549 Gonococcal infection, unspecified: Secondary | ICD-10-CM | POA: Insufficient documentation

## 2016-02-13 LAB — HIV1 RNA QUANTITATION: HIV RNA Result: NOT DETECTED {copies}/mL

## 2016-02-13 MED ORDER — AZITHROMYCIN 500 MG OR TABS
1000.0000 mg | ORAL_TABLET | Freq: Once | ORAL | Status: AC
Start: 2016-02-13 — End: 2016-02-13
  Administered 2016-02-13: 1000 mg via ORAL

## 2016-02-13 MED ORDER — CEFTRIAXONE SODIUM 250 MG IJ SOLR
250.0000 mg | Freq: Once | INTRAMUSCULAR | Status: DC
Start: 2016-02-13 — End: 2016-02-13

## 2016-02-13 MED ORDER — PENICILLIN G BENZATHINE 2400000 UNIT/4ML IM SUSY
2.4000 10*6.[IU] | PREFILLED_SYRINGE | Freq: Once | INTRAMUSCULAR | Status: AC
Start: 2016-02-13 — End: 2016-02-13
  Administered 2016-02-13: 2.4 10*6.[IU] via INTRAMUSCULAR

## 2016-02-13 MED ORDER — AZITHROMYCIN 1 G OR PACK
1.0000 g | PACK | Freq: Once | ORAL | 0 refills | Status: DC
Start: 2016-02-13 — End: 2016-02-13

## 2016-02-13 NOTE — Telephone Encounter (Signed)
Called patient, but he didn't answer. I will put in orders!

## 2016-02-13 NOTE — Telephone Encounter (Signed)
Talked to the patient today about his results. He will be coming in to clinic for treatment of Gonorrhea, Syphilis and presupmtive Chlamydia.

## 2016-02-13 NOTE — Progress Notes (Signed)
Patient comes to clinic for treatment of rectal chlamydia and syphilis. Pt states he is asymptomatic today. I administered IM penicillin and oral azithromycin to patient.

## 2016-02-13 NOTE — Progress Notes (Signed)
Called Mr. Dennis [name], but did not have any luck reaching him. I am treating him for rectal Gonorrheal with Ceftriaxone. I am presumptively treating him for Chlamydia with Azithromycin. Additionally, treating his positive RPR 1:32 with IM PCN.

## 2016-02-14 LAB — HIGH RES TYPING, HLA B (SENDOUT): High Res Typ, HLA B Result (Sendout): NEGATIVE

## 2016-02-15 LAB — SEROLOGIC SYPHILIS CONFIRM
CAPTIA T. pallidum EIA, IgG: REACTIVE — AB
RPR Screen: REACTIVE — AB
Syphilis Conf RPR Confirmation: REACTIVE — AB
Syphilis Conf RPR Quantitative Titer: 1:128 {titer}

## 2016-02-22 ENCOUNTER — Encounter (HOSPITAL_BASED_OUTPATIENT_CLINIC_OR_DEPARTMENT_OTHER): Payer: Self-pay | Admitting: Counselor

## 2016-02-22 ENCOUNTER — Ambulatory Visit (HOSPITAL_BASED_OUTPATIENT_CLINIC_OR_DEPARTMENT_OTHER): Payer: BLUE CROSS/BLUE SHIELD | Attending: Internal Medicine | Admitting: Internal Medicine

## 2016-02-22 ENCOUNTER — Other Ambulatory Visit (HOSPITAL_BASED_OUTPATIENT_CLINIC_OR_DEPARTMENT_OTHER): Payer: Self-pay | Admitting: Internal Medicine

## 2016-02-22 ENCOUNTER — Encounter (HOSPITAL_BASED_OUTPATIENT_CLINIC_OR_DEPARTMENT_OTHER): Payer: Self-pay | Admitting: Internal Medicine

## 2016-02-22 VITALS — BP 118/73 | HR 88 | Temp 98.1°F | Resp 16 | Wt 153.0 lb

## 2016-02-22 DIAGNOSIS — B20 Human immunodeficiency virus [HIV] disease: Secondary | ICD-10-CM | POA: Insufficient documentation

## 2016-02-22 DIAGNOSIS — Z6821 Body mass index (BMI) 21.0-21.9, adult: Secondary | ICD-10-CM

## 2016-02-22 MED ORDER — ELVITEG-COBIC-EMTRICIT-TENOFAF 150-150-200-10 MG OR TABS
1.0000 | ORAL_TABLET | Freq: Every day | ORAL | 3 refills | Status: DC
Start: 2016-02-22 — End: 2016-02-22

## 2016-02-22 MED ORDER — ELVITEG-COBIC-EMTRICIT-TENOFAF 150-150-200-10 MG OR TABS
1.0000 | ORAL_TABLET | Freq: Every day | ORAL | 3 refills | Status: DC
Start: 2016-02-22 — End: 2016-02-23

## 2016-02-22 NOTE — Progress Notes (Signed)
I have personally discussed the case with the resident during or immediately after the patient visit including review of history, physical exam, diagnosis, and treatment plan. Any additional comments are below or have been added to the above note. -Referred to Syphilis study for further evaluation and treatment including LP to r/o neurosyphilis

## 2016-02-22 NOTE — Patient Instructions (Addendum)
-  Continue Genvoya for now.  -RPR today.  -Syphilis study  -RTC in 1 month.

## 2016-02-22 NOTE — Progress Notes (Addendum)
[facility] Visit    ID/CC: Dennis [name] is a 25 year old year old male here for routine clinic visit.      HPI:  Dennis [name] was last seen by me at the end of January. He had a positive RPR at 1:32 at the time and is s/p IM PCN. He was also positive for rectal gonorrhea and is s/p Ceftriaxone and Azithromycin. VL UD. CD4 341. Is very overwhelmed and tearful at the prospect of changing his medication from [facility] to Descovy/dolu. Says his HIV diagnosis is upsetting and overwhelming and he did not know he had Gonorrhea and Syphilis. Has not been sexually active since the end of January. No longer having sinus issues. Denies: fevers, chills, chest pain, shortness of breath, nausea, vomiting, diarrhea, rhinorrhea, cough, face pain. Says his roommate situation is going ok--has broken down into tears multiple times during this visit. Does not want to see a therapist. Has temp security guard license.    Says he was treated for Syphilis in August 2017. PHQ-9: 3    Problem List:  There are no active problems to display for this patient.        ROS  A complete review of systems was performed and was negative except as documented in the HPI.    Medications:  No current outpatient prescriptions on file.     No current facility-administered medications for this visit.        Allergies:  Review of patient's allergies indicates:  No Known Allergies    Social History:  Social History     Social History   . Marital status: Single     Spouse name: N/A   . Number of children: N/A   . Years of education: N/A     Occupational History   . Not on file.     Social History Main Topics   . Smoking status: Not on file   . Smokeless tobacco: Not on file   . Alcohol use Not on file   . Drug use: Unknown   . Sexual activity: Not on file     Other Topics Concern   . Not on file     Social History Narrative   . No narrative on file         Physical Exam:  Vitals:    02/22/16 1531   BP: 118/73   BP Cuff Size: Regular   BP Site: Right  Arm   BP Position: Sitting   Pulse: 88   Resp: 16   Temp: 98.1 F (36.7 C)   TempSrc: Temporal   Weight: 153 lb (69.4 kg)       Gen: WD, WN in NAD. Well-appearing.  Neck: Supple, no tenderness. No cervical LAD. Thyroid normal.    Eyes: Sclera anicteric; PERRL  HENT: OP- mmm.   CV: RRR, no m/r/g, no edema  Pulm: CTA bilaterally, normal respiratory effort  Abd: Soft, NT ND +BS   Skin: no rash; warm, dry  Psych: A & O x3; normal affect  Neuro: CN 2-12 intact; Motor 5/5 throughout.  Reflexes 2+ and symmetric.  Cerebellar function intact; normal gait.    Labs:  HIV  Lab Results   Component Value Date    CD4A 0.341 (L) 02/08/2016     Lab Results   Component Value Date    CD4P 30 (L) 02/08/2016     Results for orders placed or performed in visit on 02/08/16   HIV1 RNA QUANTITATION  Result Value Ref Range    HIV RNA Specimen Plasma     HIV RNA Result None detected.  NDET [copies]/mL    HIV RNA Copies/mL (Log10) Log Transformation not applicable. [Log_copies]/mL    HIV RNA Interp       HIV-1 RNA was NOT detected in this sample using the Abbott RealTime HIV-1 RNA assay. The analytic range for this assay is 40 to 10,000,000 copies/mL.         Toxicity  No results found for this or any previous visit.  Lab Results   Component Value Date    SODIUM 137 02/08/2016    POTASSIUM 3.3 (L) 02/08/2016    CL 100 02/08/2016    CO2 28 02/08/2016    BUN 7 (L) 02/08/2016    CREATININE 0.96 02/08/2016    GLUCOSE 83 02/08/2016    CA 9.7 02/08/2016     Lab Results   Component Value Date    AST 13 02/08/2016    ALT 7 (L) 02/08/2016    ALK 71 02/08/2016    PROTEIN 8.7 (H) 02/08/2016    ALBUMIN 4.5 02/08/2016     Results for orders placed or performed in visit on 02/08/16   Urinalysis Complete, URN   Result Value Ref Range    Color, URN Yellow     Clarity, URN Clear     Specific Gravity, URN 1.020 1.002 - 1.027 g/mL    pH, URN 6.0 5.0 - 8.0    Protein (Alb Semiquant), URN 10-29(TRACE) (A) NRN mg/dL    Glucose Qual, URN Negative NRN mg/dL     Ketones, URN Negative NRN mg/dL    Bilirubin (Qual), URN Negative NRN    Occult Blood, URN Moderate(2+) (A) NRN    Nitrite, URN Negative NRN    Leukocyte Esterase, URN Negative NRN    Urobilinogen, URN 9.6-0.4 URONML [Ehrlich'U]    Comments for Macroscopic, URN       UA specimen received in the laboratory more than 2 hours after collection. For optimum detection of cellular casts, the specimen should be examined as soon as possible.    WBC, URN 0-5(NEG) Z5NEG /[HPF]    RBC, URN 9-30(2+) (A) Z2NEG /[HPF]    Bacteria, URN Not Seen NOSEEN    Epith Cells_Squamous, URN 0-5(NEG) LT6 /[LPF]    Epith Cells_Renal/Trans,URN <3(NEG) VWUJ8 /[HPF]    Comments For Microscopic, URN None NONE    Mucus, URN Present (A) NOSEEN /[LPF]       Lipids  No results found for this or any previous visit.      STI  Results for orders placed or performed in visit on 02/08/16   SEROLOGIC SYPHILIS PANEL, SRM   Result Value Ref Range    Syphilis Igg Ab Result Positive (A) NRN    Syphilis Igg Comment Confirmation testing ordered.     Syphilis Screening Status (A) SYPHN     Preliminary screening results are consistent with active syphilis or past syphilis with a serofast reaction.  Clinical correlation advised.    No results found for this or any previous visit.  No results found for this or any previous visit.    Assessment and Plan:  26 year old man with HIV (CD4 341, VL UD Feb 08, 2016).    #HIV (human immunodeficiency virus infection) ([place])  Very overwhelmed by diagnosis and prospect of changing his medications. Very tearful and would like to stay on Genvoya.    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    #Hx rectal gonorrhea  Declines STI testing today. S/p tx.    #Tearfulness  Possibly depressed, but declines therapy. Very overwhelmed by dx. Does not have to talk to HIV social worker or attend educational classes. PHQ-9 is 2.      HCM  Immunizations:   Immunization History    Administered Date(s) Administered   . influenza vaccine quadrivalent PF 02/08/2016       Recommendations:  -Continue Genvoya for now.  -RPR today.  -Refer to Syphilis study for further evaluation and treatment including LP to r/o neurosyphilis  -RTC in 1 month.

## 2016-02-22 NOTE — Addendum Note (Signed)
Addended by: Concepcion ElkPAGKAS-[name], Navpreet Szczygiel VERONICA on: 02/22/2016 05:10 PM     Modules accepted: Orders

## 2016-02-23 MED ORDER — ELVITEG-COBIC-EMTRICIT-TENOFAF 150-150-200-10 MG OR TABS
1.0000 | ORAL_TABLET | Freq: Every day | ORAL | 0 refills | Status: DC
Start: 2016-02-23 — End: 2016-05-02

## 2016-02-27 ENCOUNTER — Other Ambulatory Visit: Payer: Self-pay

## 2016-02-27 NOTE — Progress Notes (Signed)
DOCUMENT TYPE: Social Work - Follow-up Note    PATIENT: Dennis [name], Dennis [name]   MRN: [id number]  DOB: 01/01/1991    ENCOUNTER DATE:  02/22/2016    ASSOCIATED PROGRAM-CLINIC:  [place]    CONTACT TYPE:  Face to Face - Office    BRIEF DESCRIPTION:  RE: Housing/ Basic needs    PATIENT DESCRIPTION, PRESENTING PROBLEM, PATIENT / FAMILY GOALS:  26 y/o homeless AA gay male with HIV, on HAART meds [place] from [place] 3 [place] ago. Pt. is taking Genvoya and has a month worth of meds. Pt. diagnosed with HIV on 03/21/2014 previous provider Dr. Staci [place]ighterobert Com[place] at [place]. Pt. stays with his friend and looking for a job.    INTERVENTION / OUTCOME / PLAN:  SW scheduled pt. to come earlier than 5pm to go through resources for housing program and application. Pt. agreed to come next week.    Plan:  1. Assist pt. with housing and basic needs.    DOCUMENT COMPOSED BY:   Naomie DeanMeti Duressa, MSW [phone] on 02/27/2016

## 2016-03-12 ENCOUNTER — Ambulatory Visit: Payer: Self-pay | Admitting: Internal Medicine

## 2016-03-14 ENCOUNTER — Encounter (HOSPITAL_BASED_OUTPATIENT_CLINIC_OR_DEPARTMENT_OTHER): Payer: Self-pay | Admitting: Internal Medicine

## 2016-03-14 NOTE — Progress Notes (Signed)
I received outside records  From [facility] for [facility] today 03-14-16.  Records in [facility] office until pt's f/u on 04/04/16

## 2016-04-02 ENCOUNTER — Telehealth (HOSPITAL_BASED_OUTPATIENT_CLINIC_OR_DEPARTMENT_OTHER): Payer: Self-pay

## 2016-04-02 NOTE — Telephone Encounter (Signed)
Attempted to contact pt this afternoon but phone does not accept unlisted phone numbers.  Unable to complete new pt follow up call at this time.

## 2016-04-04 ENCOUNTER — Telehealth (HOSPITAL_BASED_OUTPATIENT_CLINIC_OR_DEPARTMENT_OTHER): Payer: Self-pay | Admitting: Counselor

## 2016-04-04 ENCOUNTER — Telehealth (HOSPITAL_BASED_OUTPATIENT_CLINIC_OR_DEPARTMENT_OTHER): Payer: Self-pay | Admitting: Internal Medicine

## 2016-04-04 ENCOUNTER — Encounter (HOSPITAL_BASED_OUTPATIENT_CLINIC_OR_DEPARTMENT_OTHER): Payer: BLUE CROSS/BLUE SHIELD | Admitting: Internal Medicine

## 2016-04-04 NOTE — Telephone Encounter (Signed)
Tried to reach out to [name] today because he was supposed to be seen in clinic today and also missed his appointment with the Syphilis study this week. His phone number had a message stating that there were call restrictions on his phone. Does anyone know how to find him?

## 2016-04-05 NOTE — Telephone Encounter (Signed)
Pt has an active [employer] account.  Sent him a letter via [employer] asking that he contact us to reschedule his appointments.

## 2016-04-05 NOTE — Telephone Encounter (Signed)
No answer / could not leave msg to schedule appt with Dr. Glade LloydPagkas-[name]

## 2016-04-09 NOTE — Telephone Encounter (Signed)
DOCUMENT TYPE: Social Work - Follow-up Note    PATIENT: Dennis [name], [name]   MRN: [id number]  DOB: 05/23/1990    ENCOUNTER DATE:  04/04/2016    ASSOCIATED PROGRAM-CLINIC:  [place]    CONTACT TYPE:  Attempted    BRIEF DESCRIPTION:  RE: missed appt/ housing    PATIENT DESCRIPTION, PRESENTING PROBLEM, PATIENT / FAMILY GOALS:  [age] y/o homeless AA gay male with HIV, on HAART meds moved from [place] 3 weeks ago. Pt. is taking Genvoya and has a month worth of meds. Pt. diagnosed with HIV on 03/21/2014 previous provider Dr. Staci [place]ighterobert Comer [place]. Pt. stays with his friend and looking for a job.    INTERVENTION / OUTCOME / PLAN:  SW attempted to contact pt. but pt has no voice mail set up.     Plan:  1. Assist pt. to engage with medical care.   2. Assist pt. with housing.    DOCUMENT COMPOSED BY:   Naomie DeanMeti Duressa, MSW [phone] on 04/09/2016

## 2016-05-02 ENCOUNTER — Ambulatory Visit (HOSPITAL_BASED_OUTPATIENT_CLINIC_OR_DEPARTMENT_OTHER): Payer: No Typology Code available for payment source | Attending: Internal Medicine | Admitting: Internal Medicine

## 2016-05-02 ENCOUNTER — Encounter (HOSPITAL_BASED_OUTPATIENT_CLINIC_OR_DEPARTMENT_OTHER): Payer: Self-pay | Admitting: Internal Medicine

## 2016-05-02 VITALS — BP 115/76 | HR 91 | Temp 98.2°F | Ht 71.0 in | Wt 170.0 lb

## 2016-05-02 DIAGNOSIS — Z23 Encounter for immunization: Secondary | ICD-10-CM | POA: Insufficient documentation

## 2016-05-02 DIAGNOSIS — K029 Dental caries, unspecified: Secondary | ICD-10-CM | POA: Insufficient documentation

## 2016-05-02 DIAGNOSIS — B20 Human immunodeficiency virus [HIV] disease: Secondary | ICD-10-CM | POA: Insufficient documentation

## 2016-05-02 DIAGNOSIS — Z6823 Body mass index (BMI) 23.0-23.9, adult: Secondary | ICD-10-CM

## 2016-05-02 MED ORDER — ELVITEG-COBIC-EMTRICIT-TENOFAF 150-150-200-10 MG OR TABS
1.0000 | ORAL_TABLET | Freq: Every day | ORAL | 6 refills | Status: DC
Start: 2016-05-02 — End: 2016-07-04

## 2016-05-02 NOTE — Progress Notes (Signed)
I have personally discussed the case with the resident during or immediately after the patient visit including review of history, physical exam, diagnosis, and treatment plan. I agree with the assessment and plan of care.

## 2016-05-02 NOTE — Patient Instructions (Signed)
-  Continue Genvoya.  -CD4, VL.  -Safety labs and general labs.  -Prevnar 13.  -Return to clinic in 1 month.

## 2016-05-02 NOTE — Progress Notes (Signed)
[facility] Visit    ID/CC: Dennis [name] is a 26 year old year old male here for routine clinic visit.      HPI:  Dennis [name] was last seen by me on February 22, 2016. He is currently on Genvoya, and previously was not open to transitioning to Descovy/dolutegravir. He has an undetectable viral load and CD4 of 341. He tested positive for Syphilis on 02/08/16 with a titer of 1:32 and received a shot of Benzathine PCN. He was then referred to the Syphilis study as there were come concerns for late latent Syphilis, but as it turns out, this was a primary re-infection. He did not show to the Syphilis study appointment and has no showed to [place] multiple times. Patient says that he had a breakdown since the last time since he came to the clinic because he is terrified of getting a lumbar punctures.    Patient was contacted by clinical support on March 22 in order to get housing. He is currently still looking for housing. He is staying with his friend. He says that housing is his priority. Says that things are going well with his friend at home. Says they are not romantically involved. 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 Says his mom bought a house in [place]--she still does not know about his diagnosis.    Takes Genvoya daily and has not missed any doses. He does not think that he would be able to take Descovy/dolutegravir, and would like to continue on his Genvoya. Patient was last sexually active the second week in March 2018 (oral sex only) with a man. He would like to be tested for STDs today. Patient would like to talk to social work today about housing. He is working on taking care of his skin--using black soap and coconut oil, and says he likes the results. This makes him feel good about himself.    Says that his mood has been better. He had a successful birthday--saw his mom in [place], and also went to [place]. Had "no issues". Says he misses the  food in the [place], but feels like there is not as much opportunity there.      Problem List:  HIV        ROS  A complete review of systems was performed and was negative except as documented in the HPI.    Medications:  Current Outpatient Prescriptions   Medication Sig Dispense Refill    Elviteg-Cobic-Emtricit-TenofAF (GENVOYA) 150-150-200-10 MG Oral Tab Take 1 tablet by mouth daily. Take with food. 30 tablet 0     No current facility-administered medications for this visit.        Allergies:  Review of patient's allergies indicates:  No Known Allergies    Social History:  Social History     Social History    Marital status: Single     Spouse name: N/A    Number of children: N/A    Years of education: N/A     Occupational History    Not on file.     Social History Main Topics    Smoking status: Former Smoker    Smokeless tobacco: Not on file    Alcohol use Not on file    Drug use: Unknown    Sexual activity: Not on file     Other Topics Concern    Not on file     Social History Narrative  No narrative on file       Physical Exam:  Vitals:    05/02/16 1542   BP: 115/76   BP Cuff Size: Regular   BP Site: Right Arm   BP Position: Sitting   Pulse: 91   Temp: 98.2 F (36.8 C)   TempSrc: Temporal   SpO2: 98%   Weight: 170 lb (77.1 kg)   Height: _0  (1.803 m)     Gen: WD, WN in NAD. Well-appearing.  Neck: Supple, no tenderness. No cervical LAD. Thyroid normal    Eyes: Sclera anicteric; PERRL  HENT: OP- mmm; multiple dental caries especially left back molars on the bottom    CV: RRR, no m/r/g, no edema  Pulm: CTA bilaterally, normal respiratory effort  Abd: Soft, NT ND +BS   Skin: no rash; warm, dry  Psych: A & O x3; normal affect  Neuro: CN 2-12 intact; Motor 5/5 throughout.  Reflexes 2+ and symmetric.  Cerebellar function intact.    Labs:  HIV  Lab Results   Component Value Date    CD4A 0.341 (L) 02/08/2016     Lab Results   Component Value Date    CD4P 30 (L) 02/08/2016     Results for orders placed or  performed in visit on 02/08/16   HIV1 RNA QUANTITATION   Result Value Ref Range    HIV RNA Specimen Plasma     HIV RNA Result None detected.  NDET [copies]/mL    HIV RNA Copies/mL (Log10) Log Transformation not applicable. [Log_copies]/mL    HIV RNA Interp       HIV-1 RNA was NOT detected in this sample using the Abbott RealTime HIV-1 RNA assay. The analytic range for this assay is 40 to 10,000,000 copies/mL.         Toxicity  No results found for this or any previous visit.  Lab Results   Component Value Date    SODIUM 137 02/08/2016    POTASSIUM 3.3 (L) 02/08/2016    CL 100 02/08/2016    CO2 28 02/08/2016    BUN 7 (L) 02/08/2016    CREATININE 0.96 02/08/2016    GLUCOSE 83 02/08/2016    CA 9.7 02/08/2016     Lab Results   Component Value Date    AST 13 02/08/2016    ALT 7 (L) 02/08/2016    ALK 71 02/08/2016    PROTEIN 8.7 (H) 02/08/2016    ALBUMIN 4.5 02/08/2016     Results for orders placed or performed in visit on 02/08/16   Urinalysis Complete, URN   Result Value Ref Range    Color, URN Yellow     Clarity, URN Clear     Specific Gravity, URN 1.020 1.002 - 1.027 g/mL    pH, URN 6.0 5.0 - 8.0    Protein (Alb Semiquant), URN 10-29(TRACE) (A) NRN mg/dL    Glucose Qual, URN Negative NRN mg/dL    Ketones, URN Negative NRN mg/dL    Bilirubin (Qual), URN Negative NRN    Occult Blood, URN Moderate(2+) (A) NRN    Nitrite, URN Negative NRN    Leukocyte Esterase, URN Negative NRN    Urobilinogen, URN 6.3-8.7 URONML [Ehrlich'U]    Comments for Macroscopic, URN       UA specimen received in the laboratory more than 2 hours after collection. For optimum detection of cellular casts, the specimen should be examined as soon as possible.    WBC, URN 0-5(NEG) Z5NEG /[HPF]  RBC, URN 9-30(2+) (A) Z2NEG /[HPF]    Bacteria, URN Not Seen NOSEEN    Epith Cells_Squamous, URN 0-5(NEG) LT6 /[LPF]    Epith Cells_Renal/Trans,URN <3(NEG) IEPP2 /[HPF]    Comments For Microscopic, URN None NONE    Mucus, URN Present (A) NOSEEN /[LPF]        Lipids  No results found for this or any previous visit.      STI  Results for orders placed or performed in visit on 02/08/16   SEROLOGIC SYPHILIS PANEL, SRM   Result Value Ref Range    Syphilis Igg Ab Result Positive (A) NRN    Syphilis Igg Comment Confirmation testing ordered.     Syphilis Screening Status (A) SYPHN     Preliminary screening results are consistent with active syphilis or past syphilis with a serofast reaction.  Clinical correlation advised.    No results found for this or any previous visit.  No results found for this or any previous visit.    Assessment and Plan:  26 year old man with HIV (CD4 341, VL UD Feb 08, 2016) and recently treated Syphilis (January 2018) here for follow up.    #HIV  Currently on Genvoya and does not want to change this for now. Will get safety labs and baseline HIV labs today including VL and CD4.    #Dental caries  Has multiple dental caries and needs to see a dentist. Back left molars in particular are huge cavitations. Will refer the patient to dentistry.    HCM  Prevnar 13 today.  Immunizations:   Immunization History   Administered Date(s) Administered    influenza vaccine quadrivalent PF 02/08/2016     Health Maintenance   Topic Date Due    Depression Screening (PHQ-2)  03/21/2001    HIV Screening  03/21/2004    Tetanus Vaccine  03/21/2008    Influenza Vaccine  Completed       Recommendations:  -Continue Genvoya.  -CD4, VL.  -Safety labs and general labs.  -Dental referral.  -Prevnar 13.  -Return to clinic in 1 month.

## 2016-05-02 NOTE — Progress Notes (Signed)
VACCINE SCREENING/ORDER WHEN CONTRAINDICATIONS PRESENT    Order date: 05/02/2016  Ordering provider: Pagkas  Clinic stock used: NO  Interpreter used?: None  Vaccine information sheet(s) discussed, patient/parent/guardian verbalized understanding? YES   Vaccines given:   Orders Placed This Encounter    pneumococcal (Prevnar 13) conjugate vacc (adult) 0.5 mL IM - 90670    T CELL SUBSET _ CD4 & CD8 ONLY    HIV1 RNA QUANTITATION    COMPREHENSIVE METABOLIC PANEL    CBC, DIFF    Urinalysis Complete, URN    SEROLOGIC SYPHILIS PANEL, SRM    RPR QUANTITATIVE    GC&CHLAM NUCLEIC ACID DETECTN    GC&CHLAM NUCLEIC ACID DETECTN    GC&CHLAM NUCLEIC ACID DETECTN    REFERRAL TO ORAL MED/DENTISTRY    Elviteg-Cobic-Emtricit-TenofAF (GENVOYA) 150-150-200-10 MG Oral Tab     VIS given 05/02/2016 by Carolin Sicks, MA MA.      SCREENING: INACTIVATED VACCINES  NO 1. Do you have a high fever, severe cold-like symptoms or serious infection?  NO 2. Do you have any food allergies such as eggs, chicken, chicken feathers, chicken dander, or gelatin?  NO 3. Do you have any allergies to neomycin, streptomycin, or polymyxin?   NO 4. Have you had any severe reaction to a vaccine in the past such as a seizure, a convulsion from a high fever, or a paralysis problem called Guillain-Barre Syndrome? If so, which vaccine(s): N/A  NO 5. QUESTIONS FOR WOMEN: Are you pregnant or planning on becoming pregnant in the next month?    If the patient/parent/or guardian answered "yes" to any of the above questions, consult with the provider to review the responses prior to administering vaccination. Parents with a contraindication to immunization will not receive the immunization without a specific physician order to vaccinate the patient and discussion of risk and benefits related to the contraindication.     Physician Order for Administration of Vaccine(s) When Contraindications Are Present  I have reviewed the existence in this patient's health  history of possible contraindication(s) to administration of vaccine. The benefits to this patient outweigh the potential risks of immunization. Administer vaccine(s): Prevnar [address] [place], [place], 05/02/2016 4:30 PM MA.

## 2016-05-03 LAB — GC&CHLAM NUCLEIC ACID DETECTN
Chlam Trachomatis Nucleic Acid: NEGATIVE
Chlam Trachomatis Nucleic Acid: NEGATIVE
Chlam Trachomatis Nucleic Acid: NEGATIVE
N.Gonorrhoeae(GC) Nucleic Acid: NEGATIVE
N.Gonorrhoeae(GC) Nucleic Acid: NEGATIVE
N.Gonorrhoeae(GC) Nucleic Acid: NEGATIVE

## 2016-05-30 ENCOUNTER — Encounter (HOSPITAL_BASED_OUTPATIENT_CLINIC_OR_DEPARTMENT_OTHER): Payer: No Typology Code available for payment source | Admitting: Internal Medicine

## 2016-06-06 ENCOUNTER — Encounter (HOSPITAL_BASED_OUTPATIENT_CLINIC_OR_DEPARTMENT_OTHER): Payer: Self-pay | Admitting: Counselor

## 2016-06-06 ENCOUNTER — Ambulatory Visit (HOSPITAL_BASED_OUTPATIENT_CLINIC_OR_DEPARTMENT_OTHER): Payer: No Typology Code available for payment source | Attending: Internal Medicine | Admitting: Internal Medicine

## 2016-06-06 VITALS — BP 120/86 | HR 61 | Temp 97.5°F | Resp 12 | Ht 71.0 in | Wt 171.0 lb

## 2016-06-06 DIAGNOSIS — B20 Human immunodeficiency virus [HIV] disease: Secondary | ICD-10-CM | POA: Insufficient documentation

## 2016-06-06 DIAGNOSIS — Z6823 Body mass index (BMI) 23.0-23.9, adult: Secondary | ICD-10-CM

## 2016-06-06 LAB — CBC, DIFF
% Basophils: 0 %
% Eosinophils: 1 %
% Immature Granulocytes: 0 %
% Lymphocytes: 66 %
% Monocytes: 12 %
% Neutrophils: 21 %
% Nucleated RBC: 0 %
Absolute Eosinophil Count: 0.03 10*3/uL (ref 0.00–0.50)
Absolute Lymphocyte Count: 1.63 10*3/uL (ref 1.00–4.80)
Basophils: 0.01 10*3/uL (ref 0.00–0.20)
Hematocrit: 40 % (ref 38–50)
Hemoglobin: 13.8 g/dL (ref 13.0–18.0)
Immature Granulocytes: 0.01 10*3/uL (ref 0.00–0.05)
MCH: 28.4 pg (ref 27.3–33.6)
MCHC: 34.2 g/dL (ref 32.2–36.5)
MCV: 83 fL (ref 81–98)
Monocytes: 0.31 10*3/uL (ref 0.00–0.80)
Neutrophils: 0.52 10*3/uL — ABNORMAL LOW (ref 1.80–7.00)
Nucleated RBC: 0 10*3/uL
Platelet Count: 201 10*3/uL (ref 150–400)
RBC: 4.86 10*6/uL (ref 4.40–5.60)
RDW-CV: 13.2 % (ref 11.6–14.4)
WBC: 2.51 10*3/uL — ABNORMAL LOW (ref 4.30–10.00)

## 2016-06-06 LAB — URINALYSIS COMPLETE, URN
Bacteria, URN: NONE SEEN
Bilirubin (Qual), URN: NEGATIVE
Epith Cells_Renal/Trans,URN: NEGATIVE /HPF
Epith Cells_Squamous, URN: NEGATIVE /LPF
Glucose Qual, URN: NEGATIVE mg/dL
Ketones, URN: NEGATIVE mg/dL
Leukocyte Esterase, URN: NEGATIVE
Nitrite, URN: NEGATIVE
Occult Blood, URN: NEGATIVE
RBC, URN: NEGATIVE /HPF
Specific Gravity, URN: 1.024 g/mL (ref 1.002–1.027)
WBC, URN: NEGATIVE /HPF
pH, URN: 6 (ref 5.0–8.0)

## 2016-06-06 LAB — COMPREHENSIVE METABOLIC PANEL
ALT (GPT): 21 U/L (ref 10–64)
AST (GOT): 17 U/L (ref 9–38)
Albumin: 4.7 g/dL (ref 3.5–5.2)
Alkaline Phosphatase (Total): 78 U/L (ref 35–109)
Anion Gap: 4 (ref 4–12)
Bilirubin (Total): 0.4 mg/dL (ref 0.2–1.3)
Calcium: 9.5 mg/dL (ref 8.9–10.2)
Carbon Dioxide, Total: 31 meq/L (ref 22–32)
Chloride: 103 meq/L (ref 98–108)
Creatinine: 1.06 mg/dL (ref 0.51–1.18)
GFR, Calc, African American: >60 mL/min/{1.73_m2} (ref >59)
GFR, Calc, European American: >60 mL/min/{1.73_m2} (ref >59)
Glucose: 87 mg/dL (ref 62–125)
Potassium: 3.7 meq/L (ref 3.6–5.2)
Protein (Total): 8.2 g/dL (ref 6.0–8.2)
Sodium: 138 meq/L (ref 135–145)
Urea Nitrogen: 9 mg/dL (ref 8–21)

## 2016-06-06 NOTE — Progress Notes (Signed)
[facility] Visit    ID/CC: Dennis [name] is a 26 year old year old male here for routine clinic visit.      HPI:  Last seen 05/02/16. Still working at the [employer]. Still on Genvoya. Says he is taking it every single day and has not missed doses since April 2018. Has not bene sexually active since the last visit, and says he can't even remember the last time he was active. Denies: nausea, vomiting, diarrhea, penile discharge, rash, chest pain, rhinorrhea, cough, abdominal pain. Sometimes gets RLQ pain--says it is intermittent, but not present right now. Talked to the patient about switching to Descovy/dolutegravir vs Biktarvy. He wants to think about this, and stick to Genvoya. Says his mood is up and down, and dependent on others. When asked about wanting to see a therapist, he declines. Also declines STI testing.      Problem List:  HIV        ROS  A complete review of systems was performed and was negative except as documented in the HPI.    Medications:  Current Outpatient Prescriptions   Medication Sig Dispense Refill    Elviteg-Cobic-Emtricit-TenofAF (GENVOYA) 150-150-200-10 MG Oral Tab Take 1 tablet by mouth daily. Take with food. 30 tablet 6     No current facility-administered medications for this visit.        Allergies:  Review of patient's allergies indicates:  No Known Allergies    Social History:  Social History     Social History    Marital status: Single     Spouse name: N/A    Number of children: N/A    Years of education: N/A     Occupational History    Not on file.     Social History Main Topics    Smoking status: Former Smoker    Smokeless tobacco: Not on file    Alcohol use Not on file    Drug use: Unknown    Sexual activity: Not on file     Other Topics Concern    Not on file     Social History Narrative    No narrative on file     Physical Exam:  Vital Signs: BP 120/86    Pulse 61    Temp 97.5 F (36.4 C) (Temporal)    Resp 12    Ht '5\' 11"'  (1.803 m)    Wt 171 lb  (77.6 kg)    SpO2 100%    BMI 23.85 kg/m   Gen: WD, WN in NAD. Well-appearing.  Neck: Supple, no tenderness. No cervical LAD. Thyroid normal.    Eyes: Sclera anicteric; PERRL  HENT: OP- mmm.   CV: RRR, no m/r/g, no edema  Pulm: CTA bilaterally, normal respiratory effort  Abd: Soft, NT ND +BS   Skin: no rash; warm, dry  Psych: A & O x3; normal affect  Neuro: CN 2-12 intact; Motor 5/5 throughout.  Reflexes 2+ and symmetric.  Cerebellar function intact.    Labs:  HIV  Lab Results   Component Value Date    CD4A 0.341 (L) 02/08/2016     Lab Results   Component Value Date    CD4P 30 (L) 02/08/2016     Results for orders placed or performed in visit on 02/08/16   HIV1 RNA QUANTITATION   Result Value Ref Range    HIV RNA Specimen Plasma     HIV RNA Result None detected.  NDET [copies]/mL    HIV RNA  Copies/mL (Log10) Log Transformation not applicable. [Log_copies]/mL    HIV RNA Interp       HIV-1 RNA was NOT detected in this sample using the Abbott RealTime HIV-1 RNA assay. The analytic range for this assay is 40 to 10,000,000 copies/mL.         Toxicity  No results found for this or any previous visit.  Lab Results   Component Value Date    SODIUM 137 02/08/2016    POTASSIUM 3.3 (L) 02/08/2016    CL 100 02/08/2016    CO2 28 02/08/2016    BUN 7 (L) 02/08/2016    CREATININE 0.96 02/08/2016    GLUCOSE 83 02/08/2016    CA 9.7 02/08/2016     Lab Results   Component Value Date    AST 13 02/08/2016    ALT 7 (L) 02/08/2016    ALK 71 02/08/2016    PROTEIN 8.7 (H) 02/08/2016    ALBUMIN 4.5 02/08/2016     Results for orders placed or performed in visit on 02/08/16   Urinalysis Complete, URN   Result Value Ref Range    Color, URN Yellow     Clarity, URN Clear     Specific Gravity, URN 1.020 1.002 - 1.027 g/mL    pH, URN 6.0 5.0 - 8.0    Protein (Alb Semiquant), URN 10-29(TRACE) (A) NRN mg/dL    Glucose Qual, URN Negative NRN mg/dL    Ketones, URN Negative NRN mg/dL    Bilirubin (Qual), URN Negative NRN    Occult Blood, URN  Moderate(2+) (A) NRN    Nitrite, URN Negative NRN    Leukocyte Esterase, URN Negative NRN    Urobilinogen, URN 5.3-6.6 URONML [Ehrlich'U]    Comments for Macroscopic, URN       UA specimen received in the laboratory more than 2 hours after collection. For optimum detection of cellular casts, the specimen should be examined as soon as possible.    WBC, URN 0-5(NEG) Z5NEG /[HPF]    RBC, URN 9-30(2+) (A) Z2NEG /[HPF]    Bacteria, URN Not Seen NOSEEN    Epith Cells_Squamous, URN 0-5(NEG) LT6 /[LPF]    Epith Cells_Renal/Trans,URN <3(NEG) YQIH4 /[HPF]    Comments For Microscopic, URN None NONE    Mucus, URN Present (A) NOSEEN /[LPF]       Lipids  No results found for this or any previous visit.      STI  Results for orders placed or performed in visit on 02/08/16   SEROLOGIC SYPHILIS PANEL, SRM   Result Value Ref Range    Syphilis Igg Ab Result Positive (A) NRN    Syphilis Igg Comment Confirmation testing ordered.     Syphilis Screening Status (A) SYPHN     Preliminary screening results are consistent with active syphilis or past syphilis with a serofast reaction.  Clinical correlation advised.    No results found for this or any previous visit.  No results found for this or any previous visit.    Assessment and Plan:  26 year old man with HIV (CD4 341, VL UD) here for follow up.    #HIV  Undetectable and well controlled. Contemplating switch from [place] to Descovy/dolu vs Biktarvy but wants to think about this. Will provide drug education materials. Declines STI testing today.    HCM  Immunizations:   Immunization History   Administered Date(s) Administered    influenza vaccine quadrivalent PF 02/08/2016    pneumococcal (Prevnar 13) conjugate vaccine 05/02/2016  Health Maintenance   Topic Date Due    Depression Screening (PHQ-2)  03/21/2001    HIV Screening  03/21/2004    Tetanus Vaccine  03/21/2008    Influenza Vaccine  Completed       Recommendations:  -Continue Genvoya for now  -Consider switch to  Descovy/dolutegravir vs Biktarvy  -CD4 and VL  -Declines STI testing  -Return to clinic in 1 month

## 2016-06-06 NOTE — Progress Notes (Addendum)
I have personally discussed the case with the resident during or immediately after the patient visit including review of history, physical exam, diagnosis, and treatment plan. I agree with the assessment and plan of care.     Call from [facility] WBC 2.5 with neutrophils 520. Pt on Genvoya, asymptomatic. Called patient's cell phone - no answer, left message to come back for evaluation and retesting of CBC. Message sent to Gean Quinteid Branson and Dr. Glade LloydPagkas-[name] to continue to try to reach patient for re-evaluation and testing.

## 2016-06-06 NOTE — Patient Instructions (Signed)
-  Continue Genvoya for now  -Consider switch to Descovy/dolutegravir vs Biktarvy  -CD4 and VL  -Declines STI testing  -Return to clinic in 1 month

## 2016-06-07 ENCOUNTER — Telehealth (HOSPITAL_BASED_OUTPATIENT_CLINIC_OR_DEPARTMENT_OTHER): Payer: Self-pay | Admitting: Internal Medicine

## 2016-06-07 ENCOUNTER — Other Ambulatory Visit (HOSPITAL_BASED_OUTPATIENT_CLINIC_OR_DEPARTMENT_OTHER): Payer: Self-pay | Admitting: Internal Medicine

## 2016-06-07 DIAGNOSIS — D702 Other drug-induced agranulocytosis: Secondary | ICD-10-CM

## 2016-06-07 DIAGNOSIS — D709 Neutropenia, unspecified: Secondary | ICD-10-CM | POA: Insufficient documentation

## 2016-06-07 LAB — T CELL SUBSETS CD4/CD8 ONLY COUNTS
% CD4: 27 % — ABNORMAL LOW (ref 33–61)
% CD8: 40 % — ABNORMAL HIGH (ref 14–35)
Abs CD4 Lymphocyte Cnt: 0.404 10*3/uL — ABNORMAL LOW (ref 0.730–2.250)
Abs CD8 Lymphocyte Cnt: 0.593 10*3/uL (ref 0.250–1.240)
CD4/CD8 Ratio: 0.68 — ABNORMAL LOW (ref 1.00–3.78)

## 2016-06-07 LAB — SEROLOGIC SYPHILIS PANEL, SRM: Syphilis Igg Ab Result: POSITIVE — AB

## 2016-06-07 NOTE — Telephone Encounter (Signed)
Called Dennis [name] because his ANC is around 520, which is bordering neutropenia, but I could not reach him. I have placed an order for a repeat CBC with diff to determine if this was just a fluke. There is the potential for Genvoya to cause neutropenia, and we have been in discussion about Biktarvy but he was not yet ready to switch yesterday at his appointment. Please reach out to him to get this repeat lab drawn.

## 2016-06-07 NOTE — Telephone Encounter (Signed)
I called and left a VM for pt to call the clinic.

## 2016-06-07 NOTE — Telephone Encounter (Signed)
Per Dr.Pagkas-[name]'s message, I attempted to contact pt x 3  but unsuccessful. I left a general VM to ask pt to call the clinic.

## 2016-06-11 ENCOUNTER — Telehealth (HOSPITAL_BASED_OUTPATIENT_CLINIC_OR_DEPARTMENT_OTHER): Payer: Self-pay | Admitting: Internal Medicine

## 2016-06-11 ENCOUNTER — Encounter (HOSPITAL_BASED_OUTPATIENT_CLINIC_OR_DEPARTMENT_OTHER): Payer: Self-pay | Admitting: Counselor

## 2016-06-11 ENCOUNTER — Telehealth (HOSPITAL_BASED_OUTPATIENT_CLINIC_OR_DEPARTMENT_OTHER): Payer: Self-pay | Admitting: Counselor

## 2016-06-11 NOTE — Progress Notes (Signed)
DOCUMENT TYPE: Social Work - Follow-up Note    PATIENT: Dennis [name], [name]   MRN: [id number]  DOB: 05/20/1990    ENCOUNTER DATE:  06/06/2016    ASSOCIATED PROGRAM-CLINIC:  [facility]    CONTACT TYPE:  Face to Face - Office    BRIEF DESCRIPTION:  Medical care/ Housing/ Adherence/ new phone    PATIENT DESCRIPTION, PRESENTING PROBLEM, PATIENT / FAMILY GOALS:  26 y/o AA gay male with HIV, CD4 count 404, VL undetectable, on HAART meds. Pt. is taking Genvoya. Pt. diagnosed with HIV on 03/21/2014 previous provider [place] Pt. stays with his friend and looking for permanent housing. Pt. requested assistance with low-income housing. 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    INTERVENTION / OUTCOME / PLAN:  Discussed with pt. about medical care, jobs, insurance coverage, housing and adherence. Pt. said he is working temporary jobs with max income about $1300/mo. Pt. changed his phone number to [phone]; he said he already gave this new phone number to front desk to change in the system. Pt. is looking for low-income housing and writer to check with [facility] housing program. Pt. is taking HAART meds regularly. Motivated pt. to continue engages with medical care and takes meds. Will contact pt. with housing information.     Plan:  1. Assist pt. to engage with medical care.  2. Assist pt. with stable housing.  3. F/u with adherence to HAART meds.    DOCUMENT COMPOSED BY:   Naomie DeanMeti Ragen Laver, MSW [phone] on 06/11/2016

## 2016-06-11 NOTE — Telephone Encounter (Signed)
Called patient again about his low ANC/borderline neutropenia and left a message. Awaiting call back to pager or clinic.

## 2016-06-12 LAB — RPR QUANTITATIVE (SENDOUT): RPR Quantitative (Sendout): 1:8 {titer} — AB

## 2016-06-12 LAB — HIV1 RNA QUANTITATION
HIV RNA Copies/mL (Log10): 2.12 {Log_copies}/mL
HIV RNA Result: 132 {copies}/mL — AB

## 2016-06-13 ENCOUNTER — Other Ambulatory Visit (HOSPITAL_BASED_OUTPATIENT_CLINIC_OR_DEPARTMENT_OTHER): Payer: Self-pay | Admitting: Internal Medicine

## 2016-06-13 DIAGNOSIS — B20 Human immunodeficiency virus [HIV] disease: Secondary | ICD-10-CM

## 2016-06-13 LAB — SEROLOGIC SYPHILIS CONFIRM
CAPTIA T. pallidum EIA, IgG: REACTIVE — AB
RPR Screen: REACTIVE — AB
Syphilis Conf RPR Confirmation: REACTIVE — AB
Syphilis Conf RPR Quantitative Titer: 1:8 {titer}

## 2016-06-14 ENCOUNTER — Other Ambulatory Visit (HOSPITAL_BASED_OUTPATIENT_CLINIC_OR_DEPARTMENT_OTHER): Payer: Self-pay | Admitting: Internal Medicine

## 2016-06-14 ENCOUNTER — Telehealth (HOSPITAL_BASED_OUTPATIENT_CLINIC_OR_DEPARTMENT_OTHER): Payer: Self-pay | Admitting: Internal Medicine

## 2016-06-14 ENCOUNTER — Ambulatory Visit (HOSPITAL_BASED_OUTPATIENT_CLINIC_OR_DEPARTMENT_OTHER)
Admit: 2016-06-14 | Discharge: 2016-06-14 | Disposition: A | Discharge status: Home / Self Care | Payer: No Typology Code available for payment source | Attending: Internal Medicine | Admitting: Internal Medicine

## 2016-06-14 DIAGNOSIS — B20 Human immunodeficiency virus [HIV] disease: Secondary | ICD-10-CM

## 2016-06-14 DIAGNOSIS — D702 Other drug-induced agranulocytosis: Secondary | ICD-10-CM | POA: Insufficient documentation

## 2016-06-14 LAB — CBC, DIFF
% Basophils: 0 %
% Eosinophils: 1 %
% Immature Granulocytes: 0 %
% Lymphocytes: 46 %
% Monocytes: 11 %
% Neutrophils: 42 %
% Nucleated RBC: 0 %
Absolute Eosinophil Count: 0.02 10*3/uL (ref 0.00–0.50)
Absolute Lymphocyte Count: 1.56 10*3/uL (ref 1.00–4.80)
Basophils: 0.01 10*3/uL (ref 0.00–0.20)
Hematocrit: 41 % (ref 38–50)
Hemoglobin: 14.1 g/dL (ref 13.0–18.0)
Immature Granulocytes: 0.01 10*3/uL (ref 0.00–0.05)
MCH: 28.1 pg (ref 27.3–33.6)
MCHC: 34.6 g/dL (ref 32.2–36.5)
MCV: 81 fL (ref 81–98)
Monocytes: 0.36 10*3/uL (ref 0.00–0.80)
Neutrophils: 1.44 10*3/uL — ABNORMAL LOW (ref 1.80–7.00)
Nucleated RBC: 0 10*3/uL
Platelet Count: 186 10*3/uL (ref 150–400)
RBC: 5.02 10*6/uL (ref 4.40–5.60)
RDW-CV: 12.8 % (ref 11.6–14.4)
WBC: 3.4 10*3/uL — ABNORMAL LOW (ref 4.30–10.00)

## 2016-06-14 NOTE — Telephone Encounter (Signed)
I called and spoke with Iantha FallenKenneth. He said he is in the lab to have lab drawn at this movement as we speak.

## 2016-06-14 NOTE — Telephone Encounter (Signed)
Called Iantha FallenKenneth today and we spoke about a few things:    1. I reiterated to him the importance of having his ANC rechecked due to low ANC at his last visit. CBC with diff re-ordered today.  2. VL is now detectable at 132. Now, this could be a lab error. I hope it is. We are repeating this lab today. He says he is nearly 100% adherent on Genvoya. I am repeating his integrase and genotype resistance panels in the case this this is resistance ON Genvoya. Strange, but not impossible.  3. We discussed the potential switch to Descovy/dolu vs Biktarvy, but #2 needs some answering first.    PLEASE call him if he does not go to the lab TODAY! The orders are good for 1 month, but we need answers ASAP. THANK YOU.    Of note, Syphilis RPR is 1:8, which represents a 4-fold decrease since his treatment. 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

## 2016-06-17 NOTE — Telephone Encounter (Signed)
DOCUMENT TYPE: Social Work - Follow-up Note    PATIENT: Dennis [name], Dennis [name]   MRN: [id number]  DOB: 10/20/1990    ENCOUNTER DATE:  06/11/2016    ASSOCIATED PROGRAM-CLINIC:  [facility]    CONTACT TYPE:  Telephone    BRIEF DESCRIPTION:  RE: Medical care    PATIENT DESCRIPTION, PRESENTING PROBLEM, PATIENT / FAMILY GOALS:  26 y/o AA gay male with HIV, CD4 count 404, VL 132, on HAART meds. Pt. is taking Genvoya. Pt. diagnosed with HIV on 03/21/2014 previous provider Dr. St[place]ober[place]. Pt. stays with his friend and looking for permanent housing. Pt. requested assistance with low-income housing. 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 Writer received [facility] message re: failed attempts to contact pt.    INTERVENTION / OUTCOME / PLAN:  SW called pt. to communicate medical provider's call and need for lab. Pt. said he did not get any calls from provider and it seems the attempt made with old cellphone. Writer will call pt's medical provider to give pt's phone number. Worker also explained the reason provider attempted to contact him. Pt. agreed to communicate with medical provider.     Plan:  1. Assist pt. to engage with medical care.    DOCUMENT COMPOSED BY:   Naomie DeanMeti Carisha Kantor, MSW [phone] on 06/17/2016

## 2016-06-17 NOTE — Progress Notes (Signed)
DOCUMENT TYPE: Social Work - Follow-up Note    PATIENT: Dennis [name], Dennis [name]   MRN: [id number]  DOB: 10/13/1990    ENCOUNTER DATE:  06/11/2016    ASSOCIATED PROGRAM-CLINIC:  [facility]    CONTACT TYPE:  Collateral    BRIEF DESCRIPTION:  RE: Lab/ [facility] housing    PATIENT DESCRIPTION, PRESENTING PROBLEM, PATIENT / FAMILY GOALS:  26 y/o AA gay male with HIV, CD4 count 404, VL 132, on HAART meds. Pt. is taking Genvoya. Pt. diagnosed with HIV on 03/21/2014 previous provider [place]. St[place] Pt. stays with his friend and looking for permanent housing. Pt. requested assistance with low-income housing. 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    INTERVENTION / OUTCOME / PLAN:  Received e-mail from pt's provider re: request for repeat lab and failed attempts to contact pt. Writer communicated with pt's provider and gave pt's new phone number. SW e-mailed to [facility] about pt's housing request and opportunity for low-income housing.    Plan:  1. Assist pt. to engage with medical care.  2. Assist pt. with stable housing.    DOCUMENT COMPOSED BY:   Naomie DeanMeti Bracy Pepper, MSW [phone] on 06/17/2016

## 2016-06-18 LAB — HIV1 RNA QUANTITATION: HIV RNA Result: NOT DETECTED {copies}/mL

## 2016-06-19 ENCOUNTER — Telehealth (HOSPITAL_BASED_OUTPATIENT_CLINIC_OR_DEPARTMENT_OTHER): Payer: Self-pay | Admitting: Internal Medicine

## 2016-06-19 NOTE — Telephone Encounter (Signed)
Talked to Dennis [name] about his results. VL appears to be undetectable now.  Possible a small blip of 132 on previous lab. ANC is now ~1400 up from 520. We are still likely going to switch him to Descovy/dolu vs Genvoya.

## 2016-06-21 LAB — HIV GENOTYPIC RESISTANCE ASSAY

## 2016-06-21 LAB — HIV INTEGRASE RESISTANCE ASSAY

## 2016-07-01 ENCOUNTER — Telehealth (HOSPITAL_BASED_OUTPATIENT_CLINIC_OR_DEPARTMENT_OTHER): Payer: Self-pay | Admitting: Counselor

## 2016-07-03 NOTE — Telephone Encounter (Signed)
DOCUMENT TYPE: Social Work - Follow-up Note    PATIENT: Dennis [name], Dennis [name]   MRN: [id number]  DOB: 06/02/1990    ENCOUNTER DATE:  07/01/2016    ASSOCIATED PROGRAM-CLINIC:  [facility]    CONTACT TYPE:  Telephone    BRIEF DESCRIPTION:  [facility]    PATIENT DESCRIPTION, PRESENTING PROBLEM, PATIENT / FAMILY GOALS:  26 y/o AA gay male with HIV, on HAART meds. Pt. is taking Genvoya. Pt. diagnosed with HIV on 03/21/2014 previous provider Dr. St[place]ober[place]. Pt. stays with his friend and looking for permanent housing. Pt. requested assistance with low-income housing. 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    INTERVENTION / OUTCOME / PLAN:  SW called pt. to complete [facility] housing lottery application. Provided pt. with resource and pt. agreed to complete.     Plan:  1. Assist pt. with low-income housing.    DOCUMENT COMPOSED BY:   Naomie DeanMeti Morning Halberg, MSW [phone] on 07/03/2016

## 2016-07-04 ENCOUNTER — Ambulatory Visit (HOSPITAL_BASED_OUTPATIENT_CLINIC_OR_DEPARTMENT_OTHER): Payer: No Typology Code available for payment source | Attending: Infectious Disease | Admitting: Internal Medicine

## 2016-07-04 ENCOUNTER — Encounter (HOSPITAL_BASED_OUTPATIENT_CLINIC_OR_DEPARTMENT_OTHER): Payer: Self-pay | Admitting: Internal Medicine

## 2016-07-04 VITALS — BP 117/76 | HR 70 | Temp 97.7°F | Resp 16 | Wt 168.0 lb

## 2016-07-04 DIAGNOSIS — Z6823 Body mass index (BMI) 23.0-23.9, adult: Secondary | ICD-10-CM

## 2016-07-04 DIAGNOSIS — B20 Human immunodeficiency virus [HIV] disease: Secondary | ICD-10-CM | POA: Insufficient documentation

## 2016-07-04 MED ORDER — EMTRICITABINE-TENOFOVIR AF 200-25 MG OR TABS
1.0000 | ORAL_TABLET | Freq: Every day | ORAL | 6 refills | Status: DC
Start: 2016-07-04 — End: 2017-02-12

## 2016-07-04 MED ORDER — DOLUTEGRAVIR SODIUM 50 MG OR TABS
50.0000 mg | ORAL_TABLET | Freq: Every day | ORAL | 6 refills | Status: DC
Start: 2016-07-04 — End: 2017-02-12

## 2016-07-04 NOTE — Patient Instructions (Signed)
-  Discontinue Genvoya if [employer] forces technical officerinsurance allows Descovy/dolutegravir  -Start Descovy/dolutegravir  -IF insurance does not approve Descovy/dolutegravir, continue Genvoya  -Use condoms when possible with sexual encounters  -RTC in 1 month

## 2016-07-04 NOTE — Progress Notes (Signed)
[facility]    ID/CC: Dennis [name] is a 26 year old year old male here for routine clinic visit.      HPI:    Last saw Stokely about 1 month ago. He went home to [facility] and had a good time. Says his mom's new house is big. 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 VL UD and CD4 404. Currently on Genvoya, but looking to switch to Descovy and dolutegravir. Did have a bit of near neutropenia, but that seemed to resolve between lab draws--also a side effect of Genvoya, per packet insert, though this seems less likely. Also seemed to have a blip in VL, which also resolved between 1-2 weeks on lab draws. Has not missed any doses of Genvoya since the last time he was seen in clinic about 1 month ago. Met a guy 2-3 days ago who is HIV negative, on PrEP. Denies having sex with this individual, but he is a potential love interest. Has not had sex with anyone in the last month. Does not want to be tested for STIs.Denies: fevers, chills, rhinorrhea, cough, diarrhea, constipation, hematuria, dysuria, penile discharge. Has had some RLQ pain x months--happens 3-4x per month. Says that he thinks it is dairy related.      Problem List:  Patient Active Problem List    Diagnosis Date Noted   . Neutropenia ([place]) [D70.9] 06/07/2016         ROS  A complete review of systems was performed and was negative except as documented in the HPI.    Medications:  Current Outpatient Prescriptions   Medication Sig Dispense Refill   . Elviteg-Cobic-Emtricit-TenofAF (GENVOYA) 150-150-200-10 MG Oral Tab Take 1 tablet by mouth daily. Take with food. 30 tablet 6     No current facility-administered medications for this visit.        Allergies:  Review of patient's allergies indicates:  No Known Allergies    Social History:  Social History     Social History   . Marital status: Single     Spouse name: N/A   . Number of children: N/A   . Years of education: N/A     Occupational  History   . Not on file.     Social History Main Topics   . Smoking status: Former Research scientist (life sciences)   . Smokeless tobacco: Not on file   . Alcohol use Not on file   . Drug use: Unknown   . Sexual activity: Not on file     Other Topics Concern   . Not on file     Social History Narrative   . No narrative on file       Family History:      Physical Exam:  Vital Signs: BP 117/76   Pulse 70   Temp 97.7 F (36.5 C)   Resp 16   Wt 168 lb (76.2 kg)   BMI 23.43 kg/m   Gen: WD, WN in NAD. Well-appearing.  Neck: Supple, no tenderness. No cervical LAD. Thyroid normal.    Eyes: Sclera anicteric; PERRL  HENT: OP- mmm.   CV: RRR, no m/r/g, no edema  Pulm: CTA bilaterally, normal respiratory effort  Abd: Soft, NT ND +BS   Skin: no rash; warm, dry  Psych: A & O x3; normal affect  Neuro: CN 2-12 intact; Motor 5/5 throughout.  Reflexes 2+ and symmetric.  Cerebellar function intact.    Labs:  HIV  Lab Results   Component Value Date    CD4A 0.404 (L) 06/06/2016    CD4A 0.341 (L) 02/08/2016     Lab Results   Component Value Date    CD4P 27 (L) 06/06/2016    CD4P 30 (L) 02/08/2016     Results for orders placed or performed in visit on 06/13/16   HIV1 RNA QUANTITATION   Result Value Ref Range    HIV RNA Specimen Plasma     HIV RNA Result None detected.  NDET [copies]/mL    HIV RNA Copies/mL (Log10) Log Transformation not applicable. [Log_copies]/mL    HIV RNA Interp       HIV-1 RNA was NOT detected in this sample using the Abbott RealTime HIV-1 RNA assay. The analytic range for this assay is 40 to 10,000,000 copies/mL.   Results for orders placed or performed in visit on 05/02/16   HIV1 RNA QUANTITATION   Result Value Ref Range    HIV RNA Specimen Plasma     HIV RNA Result 132 (A) NDET [copies]/mL    HIV RNA Copies/mL (Log10) 2.12 [Log_copies]/mL    HIV RNA Interp       HIV-1 RNA was DETECTED in this sample.  The analytic range for the Abbott RealTime HIV-1 RNA assay is 40 to 10,000,000 copies/mL.   Results for orders placed or performed in  visit on 02/08/16   HIV1 RNA QUANTITATION   Result Value Ref Range    HIV RNA Specimen Plasma     HIV RNA Result None detected.  NDET [copies]/mL    HIV RNA Copies/mL (Log10) Log Transformation not applicable. [Log_copies]/mL    HIV RNA Interp       HIV-1 RNA was NOT detected in this sample using the Abbott RealTime HIV-1 RNA assay. The analytic range for this assay is 40 to 10,000,000 copies/mL.         Toxicity  No results found for this or any previous visit.  Lab Results   Component Value Date    SODIUM 138 06/06/2016    POTASSIUM 3.7 06/06/2016    CL 103 06/06/2016    CO2 31 06/06/2016    BUN 9 06/06/2016    CREATININE 1.06 06/06/2016    GLUCOSE 87 06/06/2016    CA 9.5 06/06/2016     Lab Results   Component Value Date    AST 17 06/06/2016    ALT 21 06/06/2016    ALK 78 06/06/2016    PROTEIN 8.2 06/06/2016    ALBUMIN 4.7 06/06/2016     Results for orders placed or performed in visit on 05/02/16   Urinalysis Complete, URN   Result Value Ref Range    Color, URN Yellow     Clarity, URN Clear     Specific Gravity, URN 1.024 1.002 - 1.027 g/mL    pH, URN 6.0 5.0 - 8.0    Protein (Alb Semiquant), URN 10-29(TRACE) (A) NRN mg/dL    Glucose Qual, URN Negative NRN mg/dL    Ketones, URN Negative NRN mg/dL    Bilirubin (Qual), URN Negative NRN    Occult Blood, URN Negative NRN    Nitrite, URN Negative NRN    Leukocyte Esterase, URN Negative NRN    Urobilinogen, URN 9.4-7.0 URONML [Ehrlich'U]    Comments for Macroscopic, URN None     WBC, URN 0-5(NEG) Z5NEG /[HPF]    RBC, URN 0-2(NEG) Z2NEG /[HPF]    Bacteria, URN Not Seen NOSEEN    Epith Cells_Squamous,  URN 0-5(NEG) LT6 /[LPF]    Epith Cells_Renal/Trans,URN <3(NEG) SLPN3 /[HPF]    Comments For Microscopic, URN None NONE    Mucus, URN Present (A) NOSEEN /[LPF]       Lipids  No results found for this or any previous visit.      STI  Results for orders placed or performed in visit on 05/02/16   SEROLOGIC SYPHILIS PANEL, SRM   Result Value Ref Range    Syphilis Igg Ab Result  Positive (A) NRN    Syphilis Igg Comment Confirmation testing ordered.     Syphilis Screening Status (A) SYPHN     Preliminary screening results are consistent with active syphilis or past syphilis with a serofast reaction.  Clinical correlation advised.    No results found for this or any previous visit.  No results found for this or any previous visit.    Assessment and Plan:  26 year old man with HIV (CD4 404, VL UD) with random and intermittent RLQ pain x many months.    #Abdominal pain  #Possible lactose intolerance  #Bloating  Thinks this could be dairy related--gassy pain. Thinks it is related to eating dairy. Does not want to give up dairy. Told patient to write down when he has dairy in his diet and also to monitor abdominal pain since he does not want to eliminate dairy from his diet. Says he cannot drink whole milk at all--had diarrhea immediately.    #HIV  #Near neutropenia  #Random lab blip in VL  Well controlled on Genvoya but looking to switch to a better regimen. Will dc Genvoya and switch to Descovy and dolutegravir. Did check with pharmacy about starting the patient on [place] and his insurance did not approve it. ANC ~520>>~1000. Could be lab error, related to HIV, or Genvoya iteself. This has resolved. Patient declines safety labs today. Advised condom usage given the fact that there is a potential for acquiring other STIs. Patient expressed understanding.    HCM  Immunizations:   Immunization History   Administered Date(s) Administered   . influenza vaccine quadrivalent PF 02/08/2016   . pneumococcal (Prevnar 13) conjugate vaccine 05/02/2016     Health Maintenance   Topic Date Due   . Depression Screening (PHQ-2)  03/21/2001   . HIV Screening  03/21/2004   . Tetanus Vaccine  03/21/2008   . Influenza Vaccine  Completed       Recommendation  -Discontinue Genvoya if insurance allows Descovy/dolutegravir  -Start Descovy/dolutegravir  -IF insurance does not approve Descovy/dolutegravir, continue  Genvoya  -Use condoms when possible with sexual encounters  -RTC in 1 month

## 2016-07-09 NOTE — Addendum Note (Signed)
Addended by: Lynder ParentsKIM, Jarrett Albor NINA on: 07/09/2016 10:14 AM     Modules accepted: Level of Service

## 2016-07-09 NOTE — Progress Notes (Signed)
I have personally discussed the case with the fellow during or immediately after the patient visit including review of history, physical exam, diagnosis, and treatment plan. I agree with the assessment and plan of care.

## 2016-08-01 ENCOUNTER — Ambulatory Visit (HOSPITAL_BASED_OUTPATIENT_CLINIC_OR_DEPARTMENT_OTHER): Payer: No Typology Code available for payment source | Attending: Internal Medicine | Admitting: Internal Medicine

## 2016-08-01 VITALS — BP 116/82 | HR 59 | Temp 97.3°F | Resp 18

## 2016-08-01 DIAGNOSIS — F419 Anxiety disorder, unspecified: Secondary | ICD-10-CM | POA: Insufficient documentation

## 2016-08-01 DIAGNOSIS — B20 Human immunodeficiency virus [HIV] disease: Secondary | ICD-10-CM | POA: Insufficient documentation

## 2016-08-01 NOTE — Progress Notes (Signed)
[facility] Visit    ID/CC: Dennis [name] is a 26 year old year old male here for routine clinic visit.      HPI:    Dennis [name] was last seen by me on 07/04/16. I wrote him a prescription for Descovy and dolutegravir. He has not had any side effects, and takes them in the morning right after he woke up. He denies missing any doses. Confirmed that he is no longer taking Genvoya. He made the switch last week. He is suppressed. CD4 404. Met a guy, who we talked about during the last visit. Says that have just kissed, and they have not had any sexual intercourse. They are currently "getting to know each other". Dennis [name], this potential male partner is on PrEP and HIV negative.     Said that he had some abdominal pain, "an emptiness ache" this morning after taking his meds. He did not eat breakfast. He has never had this pain while taking Descovy/dolutegravir before. Denies: fever, chills, chest pain, diarrhea, constipation, cough, rhinorrhea, headache, dizziness. Says that he drinks two water bottles per day, and feels that he needs to drink more water.    Plans to move out of his friend's apartment in a month or so.    GAD7 Score: 9--mild            Problem List:  Patient Active Problem List    Diagnosis Date Noted   . Neutropenia ([place]) [D70.9] 06/07/2016         ROS  A complete review of systems was performed and was negative except as documented in the HPI.    Medications:  Current Outpatient Prescriptions   Medication Sig Dispense Refill   . Dolutegravir Sodium 50 MG Oral Tab Take 1 tablet (50 mg) by mouth daily. 30 tablet 6   . Emtricitabine-Tenofovir AF (DESCOVY) 200-25 MG Oral Tab Take 1 tablet by mouth daily. 30 tablet 6     No current facility-administered medications for this visit.        Allergies:  Review of patient's allergies indicates:  No Known Allergies    Social History:  Social History     Social History   . Marital status: Single     Spouse name: N/A   . Number of children: N/A   . Years of  education: N/A     Occupational History   . Not on file.     Social History Main Topics   . Smoking status: Former Research scientist (life sciences)   . Smokeless tobacco: Not on file   . Alcohol use Not on file   . Drug use: Unknown   . Sexual activity: Not on file     Other Topics Concern   . Not on file     Social History Narrative   . No narrative on file         Physical Exam:  Vital Signs: There were no vitals taken for this visit.  Gen: WD, WN in NAD. Well-appearing.  Neck: Supple, no tenderness. No cervical LAD. Thyroid normal.    Eyes: Sclera anicteric; PERRL  HENT: OP- mmm.   CV: RRR, no m/r/g, no edema  Pulm: CTA bilaterally, normal respiratory effort  Abd: Soft, NT ND +BS   Skin: no rash; warm, dry  Psych: A & O x3; normal affect  Neuro: CN 2-12 intact; Motor 5/5 throughout.  Reflexes 2+ and symmetric.  Cerebellar function intact.    Labs:  HIV  Lab Results   Component Value  Date    CD4A 0.404 (L) 06/06/2016    CD4A 0.341 (L) 02/08/2016     Lab Results   Component Value Date    CD4P 27 (L) 06/06/2016    CD4P 30 (L) 02/08/2016     Results for orders placed or performed in visit on 06/13/16   HIV1 RNA QUANTITATION   Result Value Ref Range    HIV RNA Specimen Plasma     HIV RNA Result None detected.  NDET [copies]/mL    HIV RNA Copies/mL (Log10) Log Transformation not applicable. [Log_copies]/mL    HIV RNA Interp       HIV-1 RNA was NOT detected in this sample using the Abbott RealTime HIV-1 RNA assay. The analytic range for this assay is 40 to 10,000,000 copies/mL.   Results for orders placed or performed in visit on 05/02/16   HIV1 RNA QUANTITATION   Result Value Ref Range    HIV RNA Specimen Plasma     HIV RNA Result 132 (A) NDET [copies]/mL    HIV RNA Copies/mL (Log10) 2.12 [Log_copies]/mL    HIV RNA Interp       HIV-1 RNA was DETECTED in this sample.  The analytic range for the Abbott RealTime HIV-1 RNA assay is 40 to 10,000,000 copies/mL.   Results for orders placed or performed in visit on 02/08/16   HIV1 RNA QUANTITATION    Result Value Ref Range    HIV RNA Specimen Plasma     HIV RNA Result None detected.  NDET [copies]/mL    HIV RNA Copies/mL (Log10) Log Transformation not applicable. [Log_copies]/mL    HIV RNA Interp       HIV-1 RNA was NOT detected in this sample using the Abbott RealTime HIV-1 RNA assay. The analytic range for this assay is 40 to 10,000,000 copies/mL.         Toxicity  No results found for this or any previous visit.  Lab Results   Component Value Date    SODIUM 138 06/06/2016    POTASSIUM 3.7 06/06/2016    CL 103 06/06/2016    CO2 31 06/06/2016    BUN 9 06/06/2016    CREATININE 1.06 06/06/2016    GLUCOSE 87 06/06/2016    CA 9.5 06/06/2016     Lab Results   Component Value Date    AST 17 06/06/2016    ALT 21 06/06/2016    ALK 78 06/06/2016    PROTEIN 8.2 06/06/2016    ALBUMIN 4.7 06/06/2016     Results for orders placed or performed in visit on 05/02/16   Urinalysis Complete, URN   Result Value Ref Range    Color, URN Yellow     Clarity, URN Clear     Specific Gravity, URN 1.024 1.002 - 1.027 g/mL    pH, URN 6.0 5.0 - 8.0    Protein (Alb Semiquant), URN 10-29(TRACE) (A) NRN mg/dL    Glucose Qual, URN Negative NRN mg/dL    Ketones, URN Negative NRN mg/dL    Bilirubin (Qual), URN Negative NRN    Occult Blood, URN Negative NRN    Nitrite, URN Negative NRN    Leukocyte Esterase, URN Negative NRN    Urobilinogen, URN 2.1-1.9 URONML [Ehrlich'U]    Comments for Macroscopic, URN None     WBC, URN 0-5(NEG) Z5NEG /[HPF]    RBC, URN 0-2(NEG) Z2NEG /[HPF]    Bacteria, URN Not Seen NOSEEN    Epith Cells_Squamous, URN 0-5(NEG) LT6 /[LPF]    [employer]  Cells_Renal/Trans,URN <3(NEG) LESS3 /[HPF]    Comments For Microscopic, URN None NONE    Mucus, URN Present (A) NOSEEN /[LPF]       Lipids  No results found for this or any previous visit.      STI  Results for orders placed or performed in visit on 05/02/16   SEROLOGIC SYPHILIS PANEL, SRM   Result Value Ref Range    Syphilis Igg Ab Result Positive (A) NRN    Syphilis Igg Comment  Confirmation testing ordered.     Syphilis Screening Status (A) SYPHN     Preliminary screening results are consistent with active syphilis or past syphilis with a serofast reaction.  Clinical correlation advised.    No results found for this or any previous visit.  No results found for this or any previous visit.    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    #HIV (human immunodeficiency virus infection) (HCC)  Currently on Descovy and dolutegravir. He does not want to be tested for STI today. Plans to use condoms if he becomes sexually active with his new friend.    #Anxiety  GAD7 score is mild. We will continue to monitor this. Declined to prescribe medical marijuana for this. Suggested CBT, talk therapy, mindfulness, and meditation but patient was not interested at this time.Specifically suggested the Headspace app free trial.        HCM  Immunizations:   Immunization History   Administered Date(s) Administered   . influenza vaccine quadrivalent PF 02/08/2016   . pneumococcal (Prevnar 13) conjugate vaccine 05/02/2016     Health Maintenance   Topic Date Due   . Depression Screening (PHQ-2)  03/21/2001   . HIV Screening  03/21/2004   . Tetanus Vaccine  03/21/2008   . Influenza Vaccine (1) 10/14/2016       Recommendations:  -Continue Descovy and dolutegravir  -CD4 and VL September 2018  -Consider cognitive behavioral therapy, mindfulness, meditation, Headspace app  -Condoms with new sexual partners

## 2016-08-01 NOTE — Patient Instructions (Signed)
-  Continue Descovy and dolutegravir  -CD4 and VL September 2018  -Consider cognitive behavioral therapy, mindfulness, meditation, Headspace app  -Condoms with new sexual encounter

## 2016-08-08 NOTE — Progress Notes (Signed)
I have personally discussed the case with the resident during or immediately after the patient visit including review of history, physical exam, diagnosis, and treatment plan. I agree with the assessment and plan of care.

## 2016-10-03 ENCOUNTER — Encounter (HOSPITAL_BASED_OUTPATIENT_CLINIC_OR_DEPARTMENT_OTHER): Payer: No Typology Code available for payment source | Admitting: Internal Medicine

## 2016-10-10 ENCOUNTER — Encounter (HOSPITAL_BASED_OUTPATIENT_CLINIC_OR_DEPARTMENT_OTHER): Payer: Self-pay | Admitting: Internal Medicine

## 2016-10-10 ENCOUNTER — Ambulatory Visit (HOSPITAL_BASED_OUTPATIENT_CLINIC_OR_DEPARTMENT_OTHER): Payer: No Typology Code available for payment source | Attending: Internal Medicine | Admitting: Internal Medicine

## 2016-10-10 VITALS — BP 120/68 | HR 60 | Temp 98.8°F | Resp 16 | Wt 175.2 lb

## 2016-10-10 DIAGNOSIS — B20 Human immunodeficiency virus [HIV] disease: Secondary | ICD-10-CM | POA: Insufficient documentation

## 2016-10-10 DIAGNOSIS — M791 Myalgia: Secondary | ICD-10-CM | POA: Insufficient documentation

## 2016-10-10 DIAGNOSIS — Z6824 Body mass index (BMI) 24.0-24.9, adult: Secondary | ICD-10-CM

## 2016-10-10 DIAGNOSIS — Z23 Encounter for immunization: Secondary | ICD-10-CM | POA: Insufficient documentation

## 2016-10-10 DIAGNOSIS — M7918 Myalgia, other site: Secondary | ICD-10-CM

## 2016-10-10 NOTE — Patient Instructions (Addendum)
-  Continue Descovy and dolutegravir  -Safety labs  -PPSV23 and influenza vaccine  -Recommended heat/ice to left scapula  -Return to clinic in 3 months or sooner if needed

## 2016-10-10 NOTE — Progress Notes (Signed)
Vaccine Screening Questions    Interpreter: No    1. Are you allergic to Latex? NO    2.  Have you had a serious reaction or an allergic reaction to a vaccine?  NO    3.  Currently have a moderate or severe illness, including fever?  NO    4.  Ever had a seizure or any neurological problem associated with a vaccine? (DTaP/TDaP/DTP pertinent) NO    5.  Is patient receiving any live vaccinations today? (Varicella-Chickenpox, MMR-Measles/Mumps/Rubella, Zoster-Shingles, Flumist, Yellow Fever) NOTE: oral rotavirus is exempt  NO    If YES to any of the questions above - Do NOT give vaccine.  Consult with RN or provider in clinic.  (#5 can be YES if all Live vaccine questions are answered NO)    If NO to all questions above - Patient may receive vaccine.    6.  Do you need to receive the Flu vaccine today? YES - Additional Flu Questions  Flu Vaccine Screening Questions:    Ever had a serious allergic reaction to eggs?  NO    Ever had Guillain-Barre syndrome associated with a vaccine? NO    Less than 6 months old? NO    If YES to any of the Flu questions above - NO Flu Vaccine to be given.  Patient may consult provider as needed.    If NO to all questions above - Patient may receive Flu Shot (IM)    If between 6 months and 79 years of age, was flu vaccine received last year?  N/A  If NO to above question:   Children who are receiving influenza vaccine for the first time - administer 2 doses of the current influenza vaccine (separated by at least 4 weeks).    Received flu vaccine and pneu 23.      All patients are encouraged to wait 15 minutes before leaving after receiving any vaccine.    VIS given 10/10/2016 by Beverely Low.

## 2016-10-10 NOTE — Progress Notes (Signed)
[facility] Visit    ID/CC: Dennis [name] is a 26 year old year old male here for routine clinic visit.      HPI:    Last seen by me in July 2019. Now has his own place in [place]. Currently working at the [employer]. Still continues Descovy and dolutegravir. Says that he has not missed any doses. Says that his anxiety has improved now that he has his own place, and is able to play video games by himself. Has not been sexually active. Smoking marijuana because it is relaxing. Denies: fevers, chills, nausea, vomiting, diarrhea, chest pain, shortness of breath, dizziness, abdominal pain. Says he has had left shoulder blade pain x 9 days--says the pain is about a 5. Has not tried any pain relieving meds. Massages it and it improves. Does not want Ibuprofen or Tylenol. Says he has a first aid kit from work with pain medication in it.      Problem List:  Patient Active Problem List    Diagnosis Date Noted    Neutropenia ([place]) [D70.9] 06/07/2016         ROS  A complete review of systems was performed and was negative except as documented in the HPI.    Medications:  Current Outpatient Prescriptions   Medication Sig Dispense Refill    Dolutegravir Sodium 50 MG Oral Tab Take 1 tablet (50 mg) by mouth daily. 30 tablet 6    Emtricitabine-Tenofovir AF (DESCOVY) 200-25 MG Oral Tab Take 1 tablet by mouth daily. 30 tablet 6     No current facility-administered medications for this visit.        Allergies:  Review of patient's allergies indicates:  No Known Allergies    Social History:  Social History     Social History    Marital status: Single     Spouse name: N/A    Number of children: N/A    Years of education: N/A     Occupational History    Not on file.     Social History Main Topics    Smoking status: Former Smoker    Smokeless tobacco: Not on file    Alcohol use Not on file    Drug use: Unknown    Sexual activity: Not on file     Other Topics Concern    Not on file     Social History Narrative       No narrative on file       Physical Exam:  Vital Signs: BP 120/68    Pulse 60    Temp 98.8 F (37.1 C) (Temporal)    Resp 16    Wt 175 lb 3.2 oz (79.5 kg)    BMI 24.44 kg/m   Gen: WD, WN in NAD. Well-appearing.  Neck: Supple, no tenderness. No cervical LAD. Thyroid normal.    Eyes: Sclera anicteric; PERRL  HENT: OP- mmm.   CV: RRR, no m/r/g, no edema  Pulm: CTA bilaterally, normal respiratory effort  Abd: Soft, NT ND +BS   Skin: no rash; warm, dry  Psych: A & O x3; normal affect  Neuro: CN 2-12 intact; Motor 5/5 throughout.  Reflexes 2+ and symmetric.  Cerebellar function intact.    Labs:  HIV  Lab Results   Component Value Date    CD4A 0.404 (L) 06/06/2016    CD4A 0.341 (L) 02/08/2016     Lab Results   Component Value Date    CD4P 27 (L) 06/06/2016  CD4P 30 (L) 02/08/2016     Results for orders placed or performed in visit on 06/13/16   HIV1 RNA QUANTITATION   Result Value Ref Range    HIV RNA Specimen Plasma     HIV RNA Result None detected.  NDET [copies]/mL    HIV RNA Copies/mL (Log10) Log Transformation not applicable. [Log_copies]/mL    HIV RNA Interp       HIV-1 RNA was NOT detected in this sample using the Abbott RealTime HIV-1 RNA assay. The analytic range for this assay is 40 to 10,000,000 copies/mL.   Results for orders placed or performed in visit on 05/02/16   HIV1 RNA QUANTITATION   Result Value Ref Range    HIV RNA Specimen Plasma     HIV RNA Result 132 (A) NDET [copies]/mL    HIV RNA Copies/mL (Log10) 2.12 [Log_copies]/mL    HIV RNA Interp       HIV-1 RNA was DETECTED in this sample.  The analytic range for the Abbott RealTime HIV-1 RNA assay is 40 to 10,000,000 copies/mL.   Results for orders placed or performed in visit on 02/08/16   HIV1 RNA QUANTITATION   Result Value Ref Range    HIV RNA Specimen Plasma     HIV RNA Result None detected.  NDET [copies]/mL    HIV RNA Copies/mL (Log10) Log Transformation not applicable. [Log_copies]/mL    HIV RNA Interp       HIV-1 RNA was NOT detected in  this sample using the Abbott RealTime HIV-1 RNA assay. The analytic range for this assay is 40 to 10,000,000 copies/mL.         Toxicity  No results found for this or any previous visit.  Lab Results   Component Value Date    SODIUM 138 06/06/2016    POTASSIUM 3.7 06/06/2016    CL 103 06/06/2016    CO2 31 06/06/2016    BUN 9 06/06/2016    CREATININE 1.06 06/06/2016    GLUCOSE 87 06/06/2016    CA 9.5 06/06/2016     Lab Results   Component Value Date    AST 17 06/06/2016    ALT 21 06/06/2016    ALK 78 06/06/2016    PROTEIN 8.2 06/06/2016    ALBUMIN 4.7 06/06/2016     Results for orders placed or performed in visit on 05/02/16   Urinalysis Complete, URN   Result Value Ref Range    Color, URN Yellow     Clarity, URN Clear     Specific Gravity, URN 1.024 1.002 - 1.027 g/mL    pH, URN 6.0 5.0 - 8.0    Protein (Alb Semiquant), URN 10-29(TRACE) (A) NRN mg/dL    Glucose Qual, URN Negative NRN mg/dL    Ketones, URN Negative NRN mg/dL    Bilirubin (Qual), URN Negative NRN    Occult Blood, URN Negative NRN    Nitrite, URN Negative NRN    Leukocyte Esterase, URN Negative NRN    Urobilinogen, URN 0.1-6.0 URONML [Ehrlich'U]    Comments for Macroscopic, URN None     WBC, URN 0-5(NEG) Z5NEG /[HPF]    RBC, URN 0-2(NEG) Z2NEG /[HPF]    Bacteria, URN Not Seen NOSEEN    Epith Cells_Squamous, URN 0-5(NEG) LT6 /[LPF]    Epith Cells_Renal/Trans,URN <3(NEG) FUXN2 /[HPF]    Comments For Microscopic, URN None NONE    Mucus, URN Present (A) NOSEEN /[LPF]       Lipids  No results found for this or  any previous visit.      STI  Results for orders placed or performed in visit on 05/02/16   SEROLOGIC SYPHILIS PANEL, SRM   Result Value Ref Range    Syphilis Igg Ab Result Positive (A) NRN    Syphilis Igg Comment Confirmation testing ordered.     Syphilis Screening Status (A) SYPHN     Preliminary screening results are consistent with active syphilis or past syphilis with a serofast reaction.  Clinical correlation advised.    No results found for  this or any previous visit.  No results found for this or any previous visit.    Assessment and Plan:  26 year old man with HIV (CD4 404, VL UD) and some mild anxiety here for follow up.    #HIV  Continue Descovy and dolutegravir. Will check a viral load today. Patient agrees to safety labs.    #Left scapular pain  #Musculoskeletal pain  Likely exacerated by moving into new apartment. Patient refuses Ibuprofen or Tylenol. Recommended heat or ice applications BID    HCM  -Flu vaccine today  -PPSV 23 today  Immunizations:   Immunization History   Administered Date(s) Administered    influenza vaccine quadrivalent PF 02/08/2016    pneumococcal (Prevnar 13) conjugate vaccine 05/02/2016     Health Maintenance   Topic Date Due    Depression Screening (PHQ-2)  03/21/2001    HIV Screening  03/21/2004    Tetanus Vaccine  03/21/2008    Influenza Vaccine (1) 10/14/2016     Recommendations:  -Continue Descovy and dolutegravir  -Safety labs  -PPSV23 and influenza vaccine  -Recommended heat/ice to left scapula  -Return to clinic in 3 months

## 2016-10-11 LAB — GC&CHLAM NUCLEIC ACID DETECTN
Chlam Trachomatis Nucleic Acid: NEGATIVE
Chlam Trachomatis Nucleic Acid: NEGATIVE
Chlam Trachomatis Nucleic Acid: NEGATIVE
N.Gonorrhoeae(GC) Nucleic Acid: NEGATIVE
N.Gonorrhoeae(GC) Nucleic Acid: NEGATIVE
N.Gonorrhoeae(GC) Nucleic Acid: NEGATIVE

## 2016-10-17 NOTE — Progress Notes (Signed)
I have personally discussed the case with the resident during or immediately after the patient visit including review of history, physical exam, diagnosis, and treatment plan. I agree with the assessment and plan of care.

## 2016-12-23 ENCOUNTER — Encounter (HOSPITAL_BASED_OUTPATIENT_CLINIC_OR_DEPARTMENT_OTHER): Payer: Self-pay | Admitting: Counselor

## 2016-12-23 ENCOUNTER — Ambulatory Visit (HOSPITAL_BASED_OUTPATIENT_CLINIC_OR_DEPARTMENT_OTHER)
Payer: No Typology Code available for payment source | Attending: Infectious Disease | Admitting: Unknown Physician Specialty

## 2016-12-23 ENCOUNTER — Encounter (HOSPITAL_BASED_OUTPATIENT_CLINIC_OR_DEPARTMENT_OTHER): Payer: Self-pay | Admitting: Unknown Physician Specialty

## 2016-12-23 VITALS — BP 111/72 | HR 78 | Temp 98.2°F | Resp 16 | Wt 167.0 lb

## 2016-12-23 DIAGNOSIS — B20 Human immunodeficiency virus [HIV] disease: Secondary | ICD-10-CM

## 2016-12-23 LAB — CBC, DIFF
% Basophils: 0 %
% Eosinophils: 1 %
% Immature Granulocytes: 0 %
% Lymphocytes: 31 %
% Monocytes: 9 %
% Neutrophils: 59 %
% Nucleated RBC: 0 %
Absolute Eosinophil Count: 0.03 10*3/uL (ref 0.00–0.50)
Absolute Lymphocyte Count: 1.57 10*3/uL (ref 1.00–4.80)
Basophils: 0.01 10*3/uL (ref 0.00–0.20)
Hematocrit: 42 % (ref 38–50)
Hemoglobin: 14.7 g/dL (ref 13.0–18.0)
Immature Granulocytes: 0.01 10*3/uL (ref 0.00–0.05)
MCH: 29.1 pg (ref 27.3–33.6)
MCHC: 35 g/dL (ref 32.2–36.5)
MCV: 83 fL (ref 81–98)
Monocytes: 0.43 10*3/uL (ref 0.00–0.80)
Neutrophils: 3.01 10*3/uL (ref 1.80–7.00)
Nucleated RBC: 0 10*3/uL
Platelet Count: 216 10*3/uL (ref 150–400)
RBC: 5.06 10*6/uL (ref 4.40–5.60)
RDW-CV: 12.9 % (ref 11.6–14.4)
WBC: 5.06 10*3/uL (ref 4.30–10.00)

## 2016-12-23 NOTE — Progress Notes (Signed)
Pt has been sch'd for 04/03/17

## 2016-12-23 NOTE — Progress Notes (Signed)
[facility] Visit    ID/CC: Dennis [name] is a 26 year old year old male here for routine clinic visit.      HPI:    Last seen 09/2016.  Still continues Descovy and dolutegravir. Not having any issues with these medicines, has bene on them <1 year.  Says that he missed one dose in the last week when traveling out of the country, otherwise no missed doses this month.    Lives on own in [place], anxiety controlled. Just got a job at [employer] and still working as a Community education officer. Had one sexual partner 2 weeks ago. Denies drug use but has a history of smoking marijuana to relax.    Last HIV level 6/1 and undetectable, leukopenic/neutropenic at that time, was on Genvoya at that time. Last STI testing 09/2016 and neg.             Problem List:  Patient Active Problem List    Diagnosis Date Noted    Neutropenia ([place]) [D70.9] 06/07/2016         ROS  A complete review of systems was performed and was negative except as documented in the HPI.    Medications:  Current Outpatient Prescriptions   Medication Sig Dispense Refill    Dolutegravir Sodium 50 MG Oral Tab Take 1 tablet (50 mg) by mouth daily. 30 tablet 6    Emtricitabine-Tenofovir AF (DESCOVY) 200-25 MG Oral Tab Take 1 tablet by mouth daily. 30 tablet 6     No current facility-administered medications for this visit.        Allergies:  Review of patient's allergies indicates:  No Known Allergies    Social History:  Social History     Social History    Marital status: Single     Spouse name: N/A    Number of children: N/A    Years of education: N/A     Occupational History    Not on file.     Social History Main Topics    Smoking status: Former Smoker    Smokeless tobacco: Not on file    Alcohol use Not on file    Drug use: Unknown    Sexual activity: Not on file     Other Topics Concern    Not on file     Social History Narrative    No narrative on file       Physical Exam:  Vital Signs: There were no vitals taken for this visit.  Gen: WD, WN in  NAD. Well-appearing.  Neck: Supple, no tenderness. No cervical LAD.    Eyes: Sclera anicteric; EOMI  HENT: MMM, no oral lesions  CV: RRR, no m/r/g, no edema  Pulm: CTA bilaterally, normal respiratory effort  Abd: Soft, NT ND +BS   Skin: no rash; warm, dry  Psych: A & O x3; normal affect  Neuro: CN 2-12 intact; Motor 5/5 throughout.     Labs:  HIV  Lab Results   Component Value Date    CD4A 0.404 (L) 06/06/2016    CD4A 0.341 (L) 02/08/2016     Lab Results   Component Value Date    CD4P 27 (L) 06/06/2016    CD4P 30 (L) 02/08/2016     Results for orders placed or performed in visit on 06/13/16   HIV1 RNA QUANTITATION   Result Value Ref Range    HIV RNA Specimen Plasma     HIV RNA Result None detected.  NDET [copies]/mL  HIV RNA Copies/mL (Log10) Log Transformation not applicable. [Log_copies]/mL    HIV RNA Interp       HIV-1 RNA was NOT detected in this sample using the Abbott RealTime HIV-1 RNA assay. The analytic range for this assay is 40 to 10,000,000 copies/mL.   Results for orders placed or performed in visit on 05/02/16   HIV1 RNA QUANTITATION   Result Value Ref Range    HIV RNA Specimen Plasma     HIV RNA Result 132 (A) NDET [copies]/mL    HIV RNA Copies/mL (Log10) 2.12 [Log_copies]/mL    HIV RNA Interp       HIV-1 RNA was DETECTED in this sample.  The analytic range for the Abbott RealTime HIV-1 RNA assay is 40 to 10,000,000 copies/mL.   Results for orders placed or performed in visit on 02/08/16   HIV1 RNA QUANTITATION   Result Value Ref Range    HIV RNA Specimen Plasma     HIV RNA Result None detected.  NDET [copies]/mL    HIV RNA Copies/mL (Log10) Log Transformation not applicable. [Log_copies]/mL    HIV RNA Interp       HIV-1 RNA was NOT detected in this sample using the Abbott RealTime HIV-1 RNA assay. The analytic range for this assay is 40 to 10,000,000 copies/mL.         Toxicity  No results found for this or any previous visit.  Lab Results   Component Value Date    SODIUM 138 06/06/2016     POTASSIUM 3.7 06/06/2016    CL 103 06/06/2016    CO2 31 06/06/2016    BUN 9 06/06/2016    CREATININE 1.06 06/06/2016    GLUCOSE 87 06/06/2016    CA 9.5 06/06/2016     Lab Results   Component Value Date    AST 17 06/06/2016    ALT 21 06/06/2016    ALK 78 06/06/2016    PROTEIN 8.2 06/06/2016    ALBUMIN 4.7 06/06/2016     Results for orders placed or performed in visit on 05/02/16   Urinalysis Complete, URN   Result Value Ref Range    Color, URN Yellow     Clarity, URN Clear     Specific Gravity, URN 1.024 1.002 - 1.027 g/mL    pH, URN 6.0 5.0 - 8.0    Protein (Alb Semiquant), URN 10-29(TRACE) (A) NRN mg/dL    Glucose Qual, URN Negative NRN mg/dL    Ketones, URN Negative NRN mg/dL    Bilirubin (Qual), URN Negative NRN    Occult Blood, URN Negative NRN    Nitrite, URN Negative NRN    Leukocyte Esterase, URN Negative NRN    Urobilinogen, URN 3.9-7.6 URONML [Ehrlich'U]    Comments for Macroscopic, URN None     WBC, URN 0-5(NEG) Z5NEG /[HPF]    RBC, URN 0-2(NEG) Z2NEG /[HPF]    Bacteria, URN Not Seen NOSEEN    Epith Cells_Squamous, URN 0-5(NEG) LT6 /[LPF]    Epith Cells_Renal/Trans,URN <3(NEG) BHAL9 /[HPF]    Comments For Microscopic, URN None NONE    Mucus, URN Present (A) NOSEEN /[LPF]       Lipids  No results found for this or any previous visit.      STI  Results for orders placed or performed in visit on 05/02/16   SEROLOGIC SYPHILIS PANEL, SRM   Result Value Ref Range    Syphilis Igg Ab Result Positive (A) NRN    Syphilis Igg Comment Confirmation testing ordered.  Syphilis Screening Status (A) SYPHN     Preliminary screening results are consistent with active syphilis or past syphilis with a serofast reaction.  Clinical correlation advised.    No results found for this or any previous visit.  No results found for this or any previous visit.    Assessment and Plan:  26 year old man with HIV (CD4 404 05/2016, VL UD 06/2016) on Descovy and dolutegravir  here for routine follow up.    #HIV  - Continue Descovy and  dolutegravir.   - Will check a viral load today  - Agrees to toxicity labs- CBC, CMP      HCM  - Flu vaccine completed this year prior to visit  - Menveo today  - STI screening: RPR, GC  Immunizations:   Immunization History   Administered Date(s) Administered    influenza vaccine quadrivalent PF 02/08/2016, 10/10/2016    pneumococcal (Pneumovax 23) polysaccharide vaccine 10/10/2016    pneumococcal (Prevnar 13) conjugate vaccine 05/02/2016     Health Maintenance   Topic Date Due    Depression Screening (PHQ-2)  03/21/2001    HIV Screening  03/21/2004    Tetanus Vaccine  03/21/2008    Influenza Vaccine  Completed     Recommendations:  - Continue Descovy and dolutegravir  - Toxicity labs - CBC, CMP  - STI screening- RPR, GC  - HIV viral load  - Menveo vaccine  - Return to clinic in 3 months    [facility] ID Fellow

## 2016-12-24 LAB — GC&CHLAM NUCLEIC ACID DETECTN
Chlam Trachomatis Nucleic Acid: NEGATIVE
Chlam Trachomatis Nucleic Acid: NEGATIVE
Chlam Trachomatis Nucleic Acid: NEGATIVE
N.Gonorrhoeae(GC) Nucleic Acid: NEGATIVE
N.Gonorrhoeae(GC) Nucleic Acid: NEGATIVE
N.Gonorrhoeae(GC) Nucleic Acid: NEGATIVE

## 2016-12-25 LAB — HIV1 RNA QUANTITATION: HIV RNA Result: NOT DETECTED {copies}/mL

## 2016-12-26 ENCOUNTER — Encounter (HOSPITAL_BASED_OUTPATIENT_CLINIC_OR_DEPARTMENT_OTHER): Payer: No Typology Code available for payment source | Admitting: Internal Medicine

## 2016-12-26 NOTE — Progress Notes (Signed)
DOCUMENT TYPE: Social Work - Follow-up Note    PATIENT: Dennis [name], Dennis [name]   MRN: [id number]  DOB: 09/26/1990    ENCOUNTER DATE:  12/23/2016    ASSOCIATED PROGRAM-CLINIC:  [facility]    CONTACT TYPE:  Face to Face - Office    BRIEF DESCRIPTION:  RE: [facility]/ [facility]/ Adherence    PATIENT DESCRIPTION, PRESENTING PROBLEM, PATIENT / FAMILY GOALS:  26 y/o AA gay male with HIV, on HAART meds. Pt. is taking Genvoya. Pt. diagnosed with HIV on 03/21/2014 previous [place] Pt. came to check about insurance enrollment.    INTERVENTION / OUTCOME / PLAN:  SW explained to pt. about insurance enrollment and [facility]. Writer checked if [facility]. received [facility]. Pt. said he did not receive any letter. Pt. is working and stated that his income is not above [facility] guidelines. SW provided pt. with [facility] customer service phone number to update. Pt. agreed to follow through and call [facility]. Pt. reported taking HAART meds regularly.     Plan:  1. Assist pt. with medical insurance coverage.   2. F/u with adherence to HAART meds.    DOCUMENT COMPOSED BY:   Naomie DeanMeti Dantrell Schertzer, MSW [phone] on 12/26/2016

## 2016-12-27 LAB — RPR QUANTITATIVE (SENDOUT): RPR Quantitative (Sendout): 1:4 {titer} — AB

## 2017-01-02 LAB — REFERENCE LABORATORY TEST 1: Reference Lab: Result 1 (Sendout): REACTIVE

## 2017-02-11 ENCOUNTER — Telehealth (HOSPITAL_BASED_OUTPATIENT_CLINIC_OR_DEPARTMENT_OTHER): Payer: Self-pay | Admitting: Internal Medicine

## 2017-02-11 DIAGNOSIS — B20 Human immunodeficiency virus [HIV] disease: Secondary | ICD-10-CM

## 2017-02-11 NOTE — Telephone Encounter (Signed)
(  TEXTING IS AN OPTION FOR [facility] CLINICS ONLY)  Is this a [facility] clinic? No      RETURN CALL: Detailed message on voicemail only      SUBJECT:  Refill Request    MEDICATION(S): Descovy, and Tridicay  NEEDED BY: 4 days left of medications  PRESCRIBING PROVIDER: Dr. Glade LloydPagkas-[name]  [id number] [address] [place]  PHARMACY PHONE: [phone]   PHARMACY FAX NUMBER: [phone]   ADDITIONAL INFORMATION: N/A

## 2017-02-12 MED ORDER — EMTRICITABINE-TENOFOVIR AF 200-25 MG OR TABS
1.0000 | ORAL_TABLET | Freq: Every day | ORAL | 2 refills | Status: DC
Start: 2017-02-12 — End: 2017-03-06

## 2017-02-12 MED ORDER — DOLUTEGRAVIR SODIUM 50 MG OR TABS
50.0000 mg | ORAL_TABLET | Freq: Every day | ORAL | 2 refills | Status: DC
Start: 2017-02-12 — End: 2017-03-06

## 2017-03-06 ENCOUNTER — Other Ambulatory Visit (HOSPITAL_BASED_OUTPATIENT_CLINIC_OR_DEPARTMENT_OTHER): Payer: Self-pay | Admitting: Internal Medicine

## 2017-03-06 ENCOUNTER — Ambulatory Visit (HOSPITAL_BASED_OUTPATIENT_CLINIC_OR_DEPARTMENT_OTHER): Payer: No Typology Code available for payment source | Attending: Infectious Disease | Admitting: Internal Medicine

## 2017-03-06 DIAGNOSIS — B20 Human immunodeficiency virus [HIV] disease: Secondary | ICD-10-CM | POA: Insufficient documentation

## 2017-03-06 DIAGNOSIS — Z6824 Body mass index (BMI) 24.0-24.9, adult: Secondary | ICD-10-CM

## 2017-03-06 LAB — TESTOSTERONE FREE & TOTAL
Sex Hormone Binding Globulin: 45 nmol/L (ref 13–90)
Testosterone Free: 61 pg/mL (ref 30–140)
Testosterone: 3.8 ng/mL (ref 2.2–7.8)

## 2017-03-06 LAB — HEPATITIS C AB WITH REFLEX PCR: Hepatitis C Antibody w/Rflx PCR: NONREACTIVE

## 2017-03-06 MED ORDER — EMTRICITABINE-TENOFOVIR AF 200-25 MG OR TABS
1.0000 | ORAL_TABLET | Freq: Every day | ORAL | 6 refills | Status: DC
Start: 2017-03-06 — End: 2017-08-11

## 2017-03-06 MED ORDER — DOLUTEGRAVIR SODIUM 50 MG OR TABS
50.0000 mg | ORAL_TABLET | Freq: Every day | ORAL | 6 refills | Status: DC
Start: 2017-03-06 — End: 2017-08-11

## 2017-03-06 NOTE — Patient Instructions (Addendum)
-  Continue Descovy and dolutegravir  -Check a testerone level  -Check viral load  -Standing orders for safety labs  -Return to clinic in June 2019

## 2017-03-06 NOTE — Progress Notes (Signed)
[facility] Visit    ID/CC: Dennis [name] is a 27 year old male here for routine clinic visit.    Interval Hx:    Says he is doing well. Last seen 01/10/17. Says he is not missing any doses of Descovy and dolutegravir. Currently working 7 days/week at [employer] and [employer]. Still lives in [place] alone. Doing well there. Sexually active with men. Says he is using condoms. Last sexually active 4 days ago. Partner is HIV negative, not on PrEP. Currently long distance with a partner that lives in [place]. They met online via Facebook. Declines STI testing today.    Denies: fevers, chills, chest pain, dyspnea, penile discharge, anal discharge, rashes/warts, constipation, myalgias, arthralgias, hematochezia.     Occasionally has abdominal pain--says they are hunger pangs. Rated as 3/10. Goes away after "a while"--15-20 minutes. No exacerbating or alleviating factors. Not related to eating. Currently does not have any of those pains. Says he is not anxious. Regular BMs--2x/day.    Patient is interested in testosterone because he wants beard. Told him I would not replace T for a beard, but could check a level.    Refuses HPV vaccination.    Was in [place], [place], [place], [place], [place] in November over Thanksgiving. Had a great time.    Problem List:  Patient Active Problem List    Diagnosis Date Noted    Neutropenia ([place]) [D70.9] 06/07/2016         ROS  A complete review of systems was performed and was negative except as documented in the HPI.    Medications:  Current Outpatient Medications   Medication Sig Dispense Refill    Dolutegravir Sodium 50 MG Oral Tab Take 1 tablet (50 mg) by mouth daily. 30 tablet 2    Emtricitabine-Tenofovir AF (DESCOVY) 200-25 MG Oral Tab Take 1 tablet by mouth daily. 30 tablet 2     No current facility-administered medications for this visit.        Allergies:  Review of patient's allergies indicates:  No Known Allergies    Physical Exam:  Vital Signs:   BP  137/80    Pulse 71    Temp 99.5 F (37.5 C) (Temporal)    Resp 16    Wt 177 lb (80.3 kg)    BMI 24.69 kg/m   PJK:DTOI in NAD  HEENT: EOMI, PERRL, bl submandibular LAD, no pharyngeal erythema  CV: RRR, no m/r/g  Pulm: lungs ctab, no increased work of breathing on RA  Abd: soft, nontender to palpation, nondistended  Neuro: alert, oriented, no focal deficits      Assessment and Plan:  27 year old man with HIV (CD4 404, VL UD) here for routine follow up.    #HIV  Well controlled on Descovy and dolutegravir. DOes not want to do safety labs today, but I will put them in as standing.    HCM  Offered HPV vaccine, which he refuses.  Immunizations:   Immunization History   Administered Date(s) Administered    influenza vaccine quadrivalent PF 02/08/2016, 10/10/2016    pneumococcal (Pneumovax 23) polysaccharide vaccine 10/10/2016    pneumococcal (Prevnar 13) conjugate vaccine 05/02/2016     Health Maintenance   Topic Date Due    Depression Screening (PHQ-2)  03/21/2001    HIV Screening  03/21/2004    Tetanus Vaccine  03/21/2008    Influenza Vaccine  Completed       Recommendations:  -Continue Descovy and dolutegravir  -Check  a testerone level  -Check viral load  -Standing orders for safety labs  -Return to clinic in June 2019      Labs:  HIV  Lab Results   Component Value Date    CD4A 0.404 (L) 06/06/2016    CD4A 0.341 (L) 02/08/2016     Lab Results   Component Value Date    CD4P 27 (L) 06/06/2016    CD4P 30 (L) 02/08/2016     Results for orders placed or performed in visit on 12/23/16   HIV1 RNA QUANTITATION   Result Value Ref Range    HIV RNA Specimen Plasma     HIV RNA Result None detected.  NDET [copies]/mL    HIV RNA Copies/mL (Log10) Log Transformation not applicable. [Log_copies]/mL    HIV RNA Interp       HIV-1 RNA was NOT detected in this sample using the Abbott RealTime HIV-1 RNA assay. The analytic range for this assay is 40 to 10,000,000 copies/mL.   Results for orders placed or performed in visit on  06/13/16   HIV1 RNA QUANTITATION   Result Value Ref Range    HIV RNA Specimen Plasma     HIV RNA Result None detected.  NDET [copies]/mL    HIV RNA Copies/mL (Log10) Log Transformation not applicable. [Log_copies]/mL    HIV RNA Interp       HIV-1 RNA was NOT detected in this sample using the Abbott RealTime HIV-1 RNA assay. The analytic range for this assay is 40 to 10,000,000 copies/mL.   Results for orders placed or performed in visit on 05/02/16   HIV1 RNA QUANTITATION   Result Value Ref Range    HIV RNA Specimen Plasma     HIV RNA Result 132 (A) NDET [copies]/mL    HIV RNA Copies/mL (Log10) 2.12 [Log_copies]/mL    HIV RNA Interp       HIV-1 RNA was DETECTED in this sample.  The analytic range for the Abbott RealTime HIV-1 RNA assay is 40 to 10,000,000 copies/mL.         Toxicity  No results found for this or any previous visit.  Lab Results   Component Value Date    SODIUM 138 06/06/2016    POTASSIUM 3.7 06/06/2016    CL 103 06/06/2016    CO2 31 06/06/2016    BUN 9 06/06/2016    CREATININE 1.06 06/06/2016    GLUCOSE 87 06/06/2016    CA 9.5 06/06/2016     Lab Results   Component Value Date    AST 17 06/06/2016    ALT 21 06/06/2016    ALK 78 06/06/2016    PROTEIN 8.2 06/06/2016    ALBUMIN 4.7 06/06/2016     Results for orders placed or performed in visit on 05/02/16   Urinalysis Complete, URN   Result Value Ref Range    Color, URN Yellow     Clarity, URN Clear     Specific Gravity, URN 1.024 1.002 - 1.027 g/mL    pH, URN 6.0 5.0 - 8.0    Protein (Alb Semiquant), URN 10-29(TRACE) (A) NRN mg/dL    Glucose Qual, URN Negative NRN mg/dL    Ketones, URN Negative NRN mg/dL    Bilirubin (Qual), URN Negative NRN    Occult Blood, URN Negative NRN    Nitrite, URN Negative NRN    Leukocyte Esterase, URN Negative NRN    Urobilinogen, URN 5.5-7.3 URONML [Ehrlich'U]    Comments for Macroscopic, URN None  WBC, URN 0-5(NEG) Z5NEG /[HPF]    RBC, URN 0-2(NEG) Z2NEG /[HPF]    Bacteria, URN Not Seen NOSEEN    Epith Cells_Squamous,  URN 0-5(NEG) LT6 /[LPF]    Epith Cells_Renal/Trans,URN <3(NEG) FIEP3 /[HPF]    Comments For Microscopic, URN None NONE    Mucus, URN Present (A) NOSEEN /[LPF]       Lipids  No results found for this or any previous visit.      STI  Results for orders placed or performed in visit on 05/02/16   SEROLOGIC SYPHILIS PANEL, SRM   Result Value Ref Range    Syphilis Igg Ab Result Positive (A) NRN    Syphilis Igg Comment Confirmation testing ordered.     Syphilis Screening Status (A) SYPHN     Preliminary screening results are consistent with active syphilis or past syphilis with a serofast reaction.  Clinical correlation advised.    No results found for this or any previous visit.  No results found for this or any previous visit.

## 2017-03-07 LAB — GC&CHLAM NUCLEIC ACID DETECTN
Chlam Trachomatis Nucleic Acid: NEGATIVE
N.Gonorrhoeae(GC) Nucleic Acid: NEGATIVE

## 2017-03-07 LAB — HIV1 RNA QUANTITATION: HIV RNA Result: NOT DETECTED {copies}/mL

## 2017-03-11 NOTE — Progress Notes (Signed)
I have personally discussed the case with the fellow during or immediately after the patient visit including review of history, physical exam, diagnosis, and treatment plan. I agree with the assessment and plan of care.

## 2017-03-12 LAB — RPR QUANTITATIVE (SENDOUT): RPR Quantitative (Sendout): 1:4 {titer} — AB

## 2017-03-14 LAB — REFERENCE LABORATORY TEST 1: Reference Lab: Result 1 (Sendout): REACTIVE

## 2017-04-03 ENCOUNTER — Encounter (HOSPITAL_BASED_OUTPATIENT_CLINIC_OR_DEPARTMENT_OTHER): Payer: No Typology Code available for payment source | Admitting: Internal Medicine

## 2017-06-19 ENCOUNTER — Encounter (HOSPITAL_BASED_OUTPATIENT_CLINIC_OR_DEPARTMENT_OTHER): Payer: No Typology Code available for payment source | Admitting: Internal Medicine

## 2017-07-09 ENCOUNTER — Telehealth (HOSPITAL_BASED_OUTPATIENT_CLINIC_OR_DEPARTMENT_OTHER): Payer: Self-pay | Admitting: Internal Medicine

## 2017-07-09 NOTE — Telephone Encounter (Signed)
(  TEXTING IS AN OPTION FOR [facility]IN[facility] ONLY)  Is this a [facility] clinic? No      RETURN CALL: Detailed message on voicemail only      SUBJECT:  General Message     REASON FOR REQUEST: continuing provider    MESSAGE: Patient has been seen by Dr Glade LloydPagkas-[name] who will be leaving the clinic. Patient is stating that his provider recommended a specific Doctor for the patient at the clinic. Patient is requesting to know the name of that particular provider. Patient does not want to see any doctor, Patient wants to see the doctor that Dr Glade LloydPagkas-[name] recommended. Please call patient and assist. Thank you.

## 2017-08-02 ENCOUNTER — Other Ambulatory Visit: Payer: Self-pay

## 2017-08-11 ENCOUNTER — Ambulatory Visit (HOSPITAL_BASED_OUTPATIENT_CLINIC_OR_DEPARTMENT_OTHER): Payer: No Typology Code available for payment source | Attending: Infectious Disease | Admitting: Infectious Disease

## 2017-08-11 ENCOUNTER — Ambulatory Visit (HOSPITAL_BASED_OUTPATIENT_CLINIC_OR_DEPARTMENT_OTHER): Payer: Self-pay | Admitting: Pharmacist

## 2017-08-11 VITALS — BP 122/78 | HR 67 | Temp 98.4°F | Resp 18 | Ht 71.65 in | Wt 177.0 lb

## 2017-08-11 DIAGNOSIS — J019 Acute sinusitis, unspecified: Secondary | ICD-10-CM | POA: Insufficient documentation

## 2017-08-11 DIAGNOSIS — B20 Human immunodeficiency virus [HIV] disease: Secondary | ICD-10-CM | POA: Insufficient documentation

## 2017-08-11 DIAGNOSIS — Z6824 Body mass index (BMI) 24.0-24.9, adult: Secondary | ICD-10-CM

## 2017-08-11 LAB — COMPREHENSIVE METABOLIC PANEL
ALT (GPT): 9 U/L — ABNORMAL LOW (ref 10–64)
AST (GOT): 12 U/L (ref 9–38)
Albumin: 4.4 g/dL (ref 3.5–5.2)
Alkaline Phosphatase (Total): 91 U/L (ref 35–109)
Anion Gap: 5 (ref 4–12)
Bilirubin (Total): 0.4 mg/dL (ref 0.2–1.3)
Calcium: 9.6 mg/dL (ref 8.9–10.2)
Carbon Dioxide, Total: 31 meq/L (ref 22–32)
Chloride: 102 meq/L (ref 98–108)
Creatinine: 1.04 mg/dL (ref 0.51–1.18)
GFR, Calc, African American: >60 mL/min/{1.73_m2} (ref >59)
GFR, Calc, European American: >60 mL/min/{1.73_m2} (ref >59)
Glucose: 91 mg/dL (ref 62–125)
Potassium: 3.6 meq/L (ref 3.6–5.2)
Protein (Total): 7.4 g/dL (ref 6.0–8.2)
Sodium: 138 meq/L (ref 135–145)
Urea Nitrogen: 7 mg/dL — ABNORMAL LOW (ref 8–21)

## 2017-08-11 LAB — URINALYSIS COMPLETE, URN
Bacteria, URN: NONE SEEN
Bilirubin (Qual), URN: NEGATIVE
Epith Cells_Renal/Trans,URN: NEGATIVE /HPF
Epith Cells_Squamous, URN: NEGATIVE /LPF
Glucose Qual, URN: NEGATIVE mg/dL
Ketones, URN: NEGATIVE mg/dL
Leukocyte Esterase, URN: NEGATIVE
Nitrite, URN: NEGATIVE
Occult Blood, URN: NEGATIVE
Protein (Alb Semiquant), URN: NEGATIVE mg/dL
Specific Gravity, URN: 1.016 g/mL (ref 1.006–1.027)
WBC, URN: NEGATIVE /HPF
pH, URN: 7 (ref 5.0–8.0)

## 2017-08-11 LAB — LIPID PANEL
Cholesterol (LDL): 39 mg/dL (ref <130)
Cholesterol/HDL Ratio: 2.2
HDL Cholesterol: 55 mg/dL (ref >39)
Non-HDL Cholesterol: 64 mg/dL (ref 0–159)
Total Cholesterol: 119 mg/dL (ref <200)
Triglyceride: 123 mg/dL (ref <150)

## 2017-08-11 MED ORDER — DOLUTEGRAVIR SODIUM 50 MG OR TABS
50.0000 mg | ORAL_TABLET | Freq: Every day | ORAL | 6 refills | Status: DC
Start: 2017-08-11 — End: 2017-11-10

## 2017-08-11 MED ORDER — EMTRICITABINE-TENOFOVIR AF 200-25 MG OR TABS
1.0000 | ORAL_TABLET | Freq: Every day | ORAL | 6 refills | Status: DC
Start: 2017-08-11 — End: 2017-11-10

## 2017-08-11 NOTE — Progress Notes (Signed)
Patient has appointment today with Dr. Lady Garyannon. When he checked in for appointment, he noticed a flyer on the wall mentioning that some HIV medications can interact with vitamins. He mentioned to the nurse that he takes centrum silver vitamin. I went in to the room briefly to discuss drug drug interactions.    CURRENT ART REGIMEN:  Dolutegravir  + Descovy    Drug drug interactions:  Oral calcium or iron supplements (including multivitamins) may decrease levels of dolutegravir  Recommendation: Administer dolutegravir 2 hours before or 6 hours after taking supplements containing calcium or iron.  Alternatively, dolutegravir and supplements containing calcium or iron can be taken together with food.    He states that he has been taking his multivitamin in the afternoon and his HIV medications in the morning, so I reassured him that he is timing his medications correctly.    I also reminded him that antacid products should also be separated from dolutegravir.    I updated his medication list to reflect that he is taking centrum silver vitamin.    I spent a total time of 5 minutes face-to-face with the patient, of which more than 50% was spent counseling and coordinating care as outlined in this note.    Dennis [name], Pharm.D  Clinical Pharmacist - [facility]

## 2017-08-12 ENCOUNTER — Encounter (HOSPITAL_BASED_OUTPATIENT_CLINIC_OR_DEPARTMENT_OTHER): Payer: Self-pay | Admitting: Pharmacist

## 2017-08-12 LAB — GC&CHLAM NUCLEIC ACID DETECTN
Chlam Trachomatis Nucleic Acid: NEGATIVE
Chlam Trachomatis Nucleic Acid: NEGATIVE
Chlam Trachomatis Nucleic Acid: NEGATIVE
N.Gonorrhoeae(GC) Nucleic Acid: NEGATIVE
N.Gonorrhoeae(GC) Nucleic Acid: NEGATIVE
N.Gonorrhoeae(GC) Nucleic Acid: NEGATIVE

## 2017-08-12 LAB — T CELL SUBSETS CD4/CD8 ONLY COUNTS
% CD4: 33 % (ref 33–61)
% CD8: 33 % (ref 14–35)
Abs CD4 Lymphocyte Cnt: 0.412 10*3/uL — ABNORMAL LOW (ref 0.730–2.250)
Abs CD8 Lymphocyte Cnt: 0.416 10*3/uL (ref 0.250–1.240)
CD4/CD8 Ratio: 0.99 — ABNORMAL LOW (ref 1.00–3.78)

## 2017-08-12 LAB — HEMOGLOBIN A1C, HPLC: Hemoglobin A1C: 5.1 % (ref 4.0–6.0)

## 2017-08-13 ENCOUNTER — Other Ambulatory Visit (HOSPITAL_BASED_OUTPATIENT_CLINIC_OR_DEPARTMENT_OTHER): Payer: Self-pay | Admitting: Pharmacist

## 2017-08-13 NOTE — Telephone Encounter (Addendum)
Responded to patient's e-care message. He states the he is not currently taking a vitamin but may in the future. For the time being, I will discontinue from medication list. If he decides to take vitamin, will add back later.

## 2017-08-14 LAB — RPR QUANTITATIVE (SENDOUT): RPR Quantitative (Sendout): 1:2 {titer} — AB

## 2017-08-14 NOTE — Progress Notes (Addendum)
[facility]  FOLLOW-UP VISIT  08/11/2017    ID/CC: Dennis [name] is a 27 year old man with well-controlled HIV here for routine visit.     ASSESSMENT/PLAN:   1. HIV (human immunodeficiency virus infection) ([place]): Dx 03/2014 in [place], RF: MSM. Initially on Genvoya, transitioned to DTG + Descovy in 06/2016 and tolerating well since. VL UD since 01/2016 save minor blip in 05/2016. Repeat VL, CD4, STI screens and safety labs today. Refilled ART, encourage ongoing adherence. Met briefly with clinical pharmacist to discuss interaction of DTG with MVI.   - Dolutegravir Sodium 50 MG Oral Tab; Take 1 tablet (50 mg) by mouth daily.  Dispense: 30 tablet; Refill: 6  - Emtricitabine-Tenofovir AF (DESCOVY) 200-25 MG Oral Tab; Take 1 tablet by mouth daily.  Dispense: 30 tablet; Refill: 6  - T Cell Subset _ CD4 and CD8 Only  - HIV1 RNA Quantitation  - Comprehensive Metabolic Panel  - Urinalysis Complete  - RPR Quantitative  - GC and Chlamydia Nucleic Acid Testing  - GC and Chlamydia Nucleic Acid Testing  - GC and Chlamydia Nucleic Acid Testing  - Lipid Panel  - Hemoglobin A1c    2. Acute non-recurrent sinusitis, unspecified location: Likely viral in etiology. He will use OTC Sudafed and let us know if sx persist > 1 week.     RTC in 6 months  ______________________________________________________________________________  HISTORY OF PRESENT ILLNESS: Previously saw Dr. Jordan Hawks who has now left the clinic. Says overall he is physically doing well. Recently noticed some clear nasal drainage and sinus pressure that he thinks is sinusitis. No other associated sx with this. Typically he takes Sudafed and that works well. Working 5d/week, living in [place] by himself. Sexually active with male partners - vers anal + oral and always uses condoms. No recent reports of STI exposures. Adherent to ART, no report missed doses or AEs. States he would like to see a therapist for psychologic counseling. Reviewed his testosterone levels -  was pleased to hear but discontented that he doesn't qualify for replacement to help with growing his beard.      REVIEW OF SYSTEMS:   Pertinent negative complaints are fever, chills, cough, sore throat, ear pain. The patient endorses rhinorrhea.     PHYSICAL EXAMINATION:   Vital Signs: Blood pressure 122/78, pulse 67, temperature 98.4 F (36.9 C), temperature source Temporal, resp. rate 18, height 5' 11.65" (1.82 m), weight 177 lb (80.3 kg), SpO2 100 %.  Constitutional: well-appearing, in no distress, well-nourished and cooperative  Eyes: anicteric, EOMI, conjunctivae clear and noninjected  NMT: mucous membranes are moist, no oropharyngeal lesions, good dentition and clear discharged noted in post OP, no exudates  Neck: supple and no cervical adenopathy  Lungs: clear to auscultation and unlabored breathing  Heart: regular rate and rhythm and no edema   Skin: warm to touch and no lesions noted  Neuro: alert and oriented x3 and no focal deficits    PERTINENT LABS RESULTS:   HIV  Lab Results   Component Value Date    CD4A 0.412 (L) 08/11/2017    CD4A 0.404 (L) 06/06/2016     Lab Results   Component Value Date    CD4P 33 08/11/2017    CD4P 27 (L) 06/06/2016     Results for orders placed or performed in visit on 08/11/17   HIV1 RNA Quantitation   Result Value Ref Range    HIV RNA Specimen Plasma     HIV RNA Result  NDET [copies]/mL    HIV RNA Copies/mL (Log10)  [Log_copies]/mL    HIV RNA Interp     Results for orders placed or performed in visit on 03/06/17   HIV1 RNA QUANTITATION   Result Value Ref Range    HIV RNA Specimen Plasma     HIV RNA Result None detected.  NDET [copies]/mL    HIV RNA Copies/mL (Log10) Log Transformation not applicable. [Log_copies]/mL    HIV RNA Interp       HIV-1 RNA was NOT detected in this sample using the Abbott RealTime HIV-1 RNA assay. The analytic range for this assay is 40 to 10,000,000 copies/mL.   Results for orders placed or performed in visit on 12/23/16   HIV1 RNA QUANTITATION    Result Value Ref Range    HIV RNA Specimen Plasma     HIV RNA Result None detected.  NDET [copies]/mL    HIV RNA Copies/mL (Log10) Log Transformation not applicable. [Log_copies]/mL    HIV RNA Interp       HIV-1 RNA was NOT detected in this sample using the Abbott RealTime HIV-1 RNA assay. The analytic range for this assay is 40 to 10,000,000 copies/mL.     VL UD > 1 year    Toxicity  Lab Results   Component Value Date    SODIUM 138 08/11/2017    POTASSIUM 3.6 08/11/2017    CL 102 08/11/2017    CO2 31 08/11/2017    BUN 7 (L) 08/11/2017    CREATININE 1.04 08/11/2017    GLUCOSE 91 08/11/2017    CA 9.6 08/11/2017     Lab Results   Component Value Date    AST 12 08/11/2017    ALT 9 (L) 08/11/2017    ALK 91 08/11/2017    PROTEIN 7.4 08/11/2017    ALBUMIN 4.4 08/11/2017     Results for orders placed or performed in visit on 08/11/17   Urinalysis Complete   Result Value Ref Range    Color, URN Yellow     Clarity, URN Clear     Specific Gravity, URN 1.016 1.006 - 1.027 g/mL    pH, URN 7.0 5.0 - 8.0    Protein (Alb Semiquant), URN Negative NRN mg/dL    Glucose Qual, URN Negative NRN mg/dL    Ketones, URN Negative NRN mg/dL    Bilirubin (Qual), URN Negative NRN    Occult Blood, URN Negative NRN    Nitrite, URN Negative NRN    Leukocyte Esterase, URN Negative NRN    Urobilinogen, URN 6.3-0.1 URONML [Ehrlich'U]    Comments for Macroscopic, URN None     WBC, URN 0-5(NEG) Z5NEG /[HPF]    RBC, URN 3-5 (1+) (A) Z2NEG /[HPF]    Bacteria, URN Not Seen NOSEEN    Epith Cells_Squamous, URN 0-5(NEG) LT6 /[LPF]    Epith Cells_Renal/Trans,URN <3(NEG) SWFU9 /[HPF]    Comments For Microscopic, URN None NONE    Mucus, URN Present (A) NOSEEN /[LPF]     Normal, no e/o toxicity    Lipids  Results for orders placed or performed in visit on 08/11/17   Lipid Panel   Result Value Ref Range    Cholesterol (Total) 119 <200 mg/dL    Triglyceride 123 <150 mg/dL    Cholesterol (HDL) 55 >39 mg/dL    Cholesterol (LDL) 39 <130 mg/dL    Non-HDL Cholesterol  64 0 - 159 mg/dL    Cholesterol/HDL Ratio 2.2     Lipid Panel, Additional Info. (NOTE)  As evidenced by results above, lipids are normal.    STI   Results for orders placed or performed in visit on 05/02/16   SEROLOGIC SYPHILIS PANEL, SRM   Result Value Ref Range    Syphilis Igg Ab Result Positive (A) NRN    Syphilis Igg Comment Confirmation testing ordered.     Syphilis Screening Status (A) SYPHN     Preliminary screening results are consistent with active syphilis or past syphilis with a serofast reaction.  Clinical correlation advised.      Results for orders placed or performed in visit on 03/06/17   RPR QUANTITATIVE   Result Value Ref Range    RPR Quantitative (Sendout) Reactive 1:4 (A) NRN     Results for orders placed or performed in visit on 08/11/17   GC and Chlamydia Nucleic Acid Testing   Result Value Ref Range    GC&Chlam NA Spec Desc Rectal     Chlam Trachomatis Nucleic Acid Negative NRN    N.Gonorrhoeae(GC) Nucleic Acid Negative NRN   GC and Chlamydia Nucleic Acid Testing   Result Value Ref Range    GC&Chlam NA Spec Desc Urine     Chlam Trachomatis Nucleic Acid Negative NRN    N.Gonorrhoeae(GC) Nucleic Acid Negative NRN   GC and Chlamydia Nucleic Acid Testing   Result Value Ref Range    GC&Chlam NA Spec Desc Pharynx     Chlam Trachomatis Nucleic Acid Negative NRN    N.Gonorrhoeae(GC) Nucleic Acid Negative NRN     GC/CT screens neg, 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    HEALTH CARE MAINTENANCE   Routine/HIV Health Care Maintenance:    -Lipids: normal 07/2017  -Hypertension (annually): normal  -Tobacco use: denies    STD Screening:    -Syphilis (annually if low risk; high risk every 3-6 months): serofast RPR @ 1:4 in 02/2017, early syphilis treated in 01/2016 (1:32 -> 1:4)  -GC/CT (yearly or if RF every 3-6 months): negative screen x3 07/2017    Immunizations/serologies  -- discussed  Menveo and HPV today, he will think about these for the future but in general states he "doesn't like put anything unnecessary into my body"  -toxo (intake): neg 01/2016  -PPD/IGRA (intake and annually if risk factors): neg 01/2016  -HAV (intake): IgG pos 01/2016  -HBV (intake): sAb pos, sAg neg, cAb neg 01/2016  -HCV (intake): NR 02/2017    Immunization History   Administered Date(s) Administered    influenza vaccine quadrivalent PF 02/08/2016, 10/10/2016    pneumococcal (Pneumovax 23) polysaccharide vaccine 10/10/2016    pneumococcal (Prevnar 13) conjugate vaccine 05/02/2016

## 2017-08-14 NOTE — Progress Notes (Signed)
I have personally discussed the case with Dr. Cannon immediately after the patient visit including review of history, physical exam, diagnosis, and treatment plan. I agree with the documentation in his note.

## 2017-08-15 ENCOUNTER — Encounter (HOSPITAL_BASED_OUTPATIENT_CLINIC_OR_DEPARTMENT_OTHER): Payer: Self-pay | Admitting: Infectious Disease

## 2017-08-15 LAB — HIV1 RNA QUANTITATION: HIV RNA Result: NOT DETECTED {copies}/mL

## 2017-08-19 ENCOUNTER — Encounter (HOSPITAL_BASED_OUTPATIENT_CLINIC_OR_DEPARTMENT_OTHER): Payer: Self-pay | Admitting: Infectious Disease

## 2017-08-19 LAB — REFERENCE LABORATORY TEST 1: Reference Lab: Result 1 (Sendout): REACTIVE

## 2017-08-20 ENCOUNTER — Encounter (HOSPITAL_BASED_OUTPATIENT_CLINIC_OR_DEPARTMENT_OTHER): Payer: Self-pay | Admitting: Counselor

## 2017-09-11 ENCOUNTER — Encounter (HOSPITAL_BASED_OUTPATIENT_CLINIC_OR_DEPARTMENT_OTHER): Payer: Self-pay | Admitting: Counselor

## 2017-10-09 ENCOUNTER — Other Ambulatory Visit (HOSPITAL_BASED_OUTPATIENT_CLINIC_OR_DEPARTMENT_OTHER): Payer: Self-pay | Admitting: Infectious Disease

## 2017-11-10 ENCOUNTER — Encounter (HOSPITAL_BASED_OUTPATIENT_CLINIC_OR_DEPARTMENT_OTHER): Payer: Self-pay | Admitting: Counselor

## 2017-11-10 ENCOUNTER — Encounter (HOSPITAL_BASED_OUTPATIENT_CLINIC_OR_DEPARTMENT_OTHER): Payer: Self-pay | Admitting: Infectious Disease

## 2017-11-10 ENCOUNTER — Ambulatory Visit (HOSPITAL_BASED_OUTPATIENT_CLINIC_OR_DEPARTMENT_OTHER): Payer: No Typology Code available for payment source | Attending: Internal Medicine | Admitting: Infectious Disease

## 2017-11-10 VITALS — BP 113/73 | HR 65 | Temp 98.2°F | Resp 16 | Ht 71.93 in | Wt 186.8 lb

## 2017-11-10 DIAGNOSIS — R635 Abnormal weight gain: Secondary | ICD-10-CM | POA: Insufficient documentation

## 2017-11-10 DIAGNOSIS — Z6825 Body mass index (BMI) 25.0-25.9, adult: Secondary | ICD-10-CM

## 2017-11-10 DIAGNOSIS — Z21 Asymptomatic human immunodeficiency virus [HIV] infection status: Secondary | ICD-10-CM | POA: Insufficient documentation

## 2017-11-10 LAB — COMPREHENSIVE METABOLIC PANEL
ALT (GPT): 20 U/L (ref 10–64)
AST (GOT): 18 U/L (ref 9–38)
Albumin: 4.8 g/dL (ref 3.5–5.2)
Alkaline Phosphatase (Total): 89 U/L (ref 35–109)
Anion Gap: 4 (ref 4–12)
Bilirubin (Total): 0.5 mg/dL (ref 0.2–1.3)
Calcium: 9.5 mg/dL (ref 8.9–10.2)
Carbon Dioxide, Total: 30 meq/L (ref 22–32)
Chloride: 101 meq/L (ref 98–108)
Creatinine: 1.05 mg/dL (ref 0.51–1.18)
GFR, Calc, African American: >60 mL/min/{1.73_m2} (ref >59)
GFR, Calc, European American: >60 mL/min/{1.73_m2} (ref >59)
Glucose: 81 mg/dL (ref 62–125)
Potassium: 3.6 meq/L (ref 3.6–5.2)
Protein (Total): 8 g/dL (ref 6.0–8.2)
Sodium: 135 meq/L (ref 135–145)
Urea Nitrogen: 10 mg/dL (ref 8–21)

## 2017-11-10 LAB — LIPID PANEL
Cholesterol (LDL): 95 mg/dL (ref <130)
Cholesterol/HDL Ratio: 2.8
HDL Cholesterol: 59 mg/dL (ref >39)
Non-HDL Cholesterol: 109 mg/dL (ref 0–159)
Total Cholesterol: 168 mg/dL (ref <200)
Triglyceride: 71 mg/dL (ref <150)

## 2017-11-10 LAB — URINALYSIS COMPLETE, URN
Bacteria, URN: NONE SEEN
Bilirubin (Qual), URN: NEGATIVE
Epith Cells_Renal/Trans,URN: NEGATIVE /HPF
Epith Cells_Squamous, URN: NEGATIVE /LPF
Glucose Qual, URN: NEGATIVE mg/dL
Ketones, URN: NEGATIVE mg/dL
Leukocyte Esterase, URN: NEGATIVE
Nitrite, URN: NEGATIVE
Occult Blood, URN: NEGATIVE
Protein (Alb Semiquant), URN: NEGATIVE mg/dL
RBC, URN: NEGATIVE /HPF
Specific Gravity, URN: 1.024 g/mL (ref 1.006–1.027)
WBC, URN: NEGATIVE /HPF
pH, URN: 8 (ref 5.0–8.0)

## 2017-11-10 MED ORDER — DOLUTEGRAVIR SODIUM 50 MG OR TABS
50.0000 mg | ORAL_TABLET | Freq: Every day | ORAL | 5 refills | Status: DC
Start: 2017-11-10 — End: 2018-03-30

## 2017-11-10 MED ORDER — EMTRICITABINE-TENOFOVIR AF 200-25 MG OR TABS
1.0000 | ORAL_TABLET | Freq: Every day | ORAL | 5 refills | Status: DC
Start: 2017-11-10 — End: 2018-03-30

## 2017-11-10 NOTE — Progress Notes (Signed)
Dennis [name]  FOLLOW-UP VISIT  11/10/2017    ID/CC: Dennis [name] is a 27 year old man with HIV here for routine visit.     ASSESSMENT/PLAN:   1. Asymptomatic HIV infection (HCC): History as below, currently CD4 412 in 07/2017 with UD VL since early 2018 save one blip last year. Safety labs and VL check with STI testing today. Refill ART, encouraged continued adherence.  - Dolutegravir Sodium 50 MG Oral Tablet; Take 1 tablet (50 mg) by mouth daily.  Dispense: 30 tablet; Refill: 5  - Emtricitabine-Tenofovir AF (DESCOVY) 200-25 MG Oral Tablet; Take 1 tablet by mouth daily.  Dispense: 30 tablet; Refill: 5  - HIV1 RNA Quantitation  - Comprehensive Metabolic Panel  - Urinalysis Complete  - RPR Quantitative  - GC and Chlamydia Nucleic Acid Testing  - GC and Chlamydia Nucleic Acid Testing  - GC and Chlamydia Nucleic Acid Testing  - Lipid Panel    2. Weight gain: Noted gain of ~ 8.5 kg since 12/2016. We talked through why this might be -- seems like calorie intake (carbs and alcohol) may be greater than aerobic exercise. Recommended he cut back on these to see if weight begins to normalize. Will offer nutrition referral to assist with this. Thankfully no associated metabolic syndrome. Continue to monitor.    RTC in 3 months  ________________________________________________________________________________________________________  HISTORY OF PRESENT ILLNESS: Working on a new hairstyle right now. Feels life is going well. Reports feeling good physically, but has noticed he's gained some weight in the last several months. He denies drinking sodas or many sweets, but does like carbs such as potatoes/bread/rice, etc. Mostly cooks/eats at home rather than out. Drinks 1-2 glasses of wine +/- a couple drinks of Dennis [name] a few times a week at most with a friend. He tries to stay active, does quite a bit of walking. Wants to ensure he remains in overall good health.    Taking ART without issues, denies any missed doses. He  reports his partner was diagnosed with HIV in 07/2017 -- now on ART also and suppressed, but this was a shock to him. Dennis [name] knows his own status and that he didn't transmit anything. They have discussed the issue and are doing ok right now. He denies reports from any other partners of STI exposure and has no concerning sx presently. Dennis [name] is considering starting to go to therapy after talking with Dennis [name].     REVIEW OF SYSTEMS:   I performed a complete review of systems today and have documented the patient's positive and pertinent negative complaints above. All remaining systems are negative.     PHYSICAL EXAMINATION:   Vital Signs: Blood pressure 113/73, pulse 65, temperature 98.2 F (36.8 C), temperature source Temporal, resp. rate 16, height 5' 11.93" (1.827 m), weight 186 lb 12.8 oz (84.7 kg), SpO2 99 %.  Constitutional: well-appearing, well-groomed, in no distress and cooperative  Skin: no rash noted on extremities and no lesions noted  Neuro: oriented x3 and no focal deficits  mood is "good", affect appropriate    HIV HISTORY:  HIV PCP: Dennis [name] --> Dennis [name]  Case Manager: Dennis [name]  Diagnosis Date: 03/2014 in [place]  Risk Factors for HIV: MSM  CD4 Nadir: Unk, CD4 404 in 05/2016  ARV/Resistance History: Unk resistance. Started Genvoya at dx --> DTG + FTC/TAC in 06/2016  HIV-Related Illnesses: None known  Viral Hepatitis: HAV immune 01/2016; HBsAb pos, sAg neg, cAb neg 01/2016; HCV NR 02/2017    I  personally reviewed and verified the documented history with the patient and agree with it    HIV General Healthcare Maintenance  Cardiovascular/metabolic  Lipids: nl in 07/2017  HTN: nl screens at last 2 visits  DM: A1c nl 07/2017  UA: microhematuria in 07/2017 -- repeat today. He denies any prior h/o LUTS or renal stones    Sexually transmitted infections  Syphilis: serofast RPR 1:4 in 02/2017 after tx for early syphilis in 01/2016 (initial RPR 1:32) -- recheck today  GC/CT: neg screen x3 in 07/2017 -- recheck  today    Immunizations/Serologies  Quantiferon neg 01/2016  Toxo neg 01/2016  Declines flu, Menveo and HPV vaccines -- we discussed the theory and purpose of immunizations and their safety today  Immunization History   Administered Date(s) Administered    influenza vaccine quadrivalent PF 02/08/2016, 10/10/2016    pneumococcal (Pneumovax 23) polysaccharide vaccine 10/10/2016    pneumococcal (Prevnar 13) conjugate vaccine 05/02/2016     Cancer screening -- n/a

## 2017-11-11 LAB — GC&CHLAM NUCLEIC ACID DETECTN
Chlam Trachomatis Nucleic Acid: NEGATIVE
Chlam Trachomatis Nucleic Acid: NEGATIVE
Chlam Trachomatis Nucleic Acid: NEGATIVE
N.Gonorrhoeae(GC) Nucleic Acid: NEGATIVE
N.Gonorrhoeae(GC) Nucleic Acid: NEGATIVE
N.Gonorrhoeae(GC) Nucleic Acid: NEGATIVE

## 2017-11-12 NOTE — Progress Notes (Signed)
DOCUMENT TYPE: Social Work - Follow-up Note    PATIENT: Dennis [name], Dennis [name]   MRN: [id number]  DOB: January 02, 1991    ENCOUNTER DATE:  11/10/2017    ASSOCIATED PROGRAM-CLINIC:  [place]    CONTACT TYPE:  Face to Face - Office    BRIEF DESCRIPTION:  RE: Medical care/ Housing/ TSV/ CMHC/ ROI/ Adherence    PATIENT DESCRIPTION, PRESENTING PROBLEM, PATIENT / FAMILY GOALS:  27 y/o AA gay male with HIV, CD4 count 412, VL undetectable on HAART meds established HIV care and case management at [facility] clinic. Pt. is taking Genvoya. Pt. diagnosed with HIV on 03/21/2014 previous provider was Dr. Staci Righter at [place], [place]. Pt. lives with roommate and requested assistance with low-income housing program. Pt. is on [facility] for medical coverage. Release of information expired.    INTERVENTION / OUTCOME / PLAN:  Discussed with pt. about medical care, insurance coverage, update income, housing program and adherence to HAART meds. Pt. lives with roommate and not happy with living condition. He requested assistance with low-income housing. SW educated pt. about [facility] [employer] housing program and pt. interested to contact. Writer called [facility] housing case manager re: housing program. Selam said [facility] will accept the pt. if he is not on lease. Worker explained this to pt. and he said he is going to work on this issue and TEFL teacher provided [facility] contact phone number and address. Pt. is taking HAART meds regularly. Provided pt. with Thrift store voucher (TSV) for seasonal clothes. Reviewed with pt. about [facility] coverage and income guidelines. Pt. said he is making with the income guidelines and active on [facility]. Educated pt. about [employer] QHP, income limit and EIP/EHIP. Pt. will inform SW when he gets job that pays him better to transfer to [facility]. Reminded pt. to report to [facility] if he makes about [facility] income guidelines and pt. agreed to do so. Pt. uses [place] card for transportation. Updated release of information.      Plan:  1. Assist pt. to engage with medical care.  2. F/u with adherence to HAART meds.  3. Referral to [facility] for housing.  4. Assist pt. with basic needs/ TSV.    DOCUMENT COMPOSED BY:   [name], MSW [phone] on 11/12/2017

## 2017-11-13 ENCOUNTER — Encounter (HOSPITAL_BASED_OUTPATIENT_CLINIC_OR_DEPARTMENT_OTHER): Payer: Self-pay | Admitting: Infectious Disease

## 2017-11-13 DIAGNOSIS — B20 Human immunodeficiency virus [HIV] disease: Secondary | ICD-10-CM | POA: Insufficient documentation

## 2017-11-13 LAB — HIV1 RNA QUANTITATION: HIV RNA Result: NOT DETECTED {copies}/mL

## 2017-11-14 ENCOUNTER — Encounter (HOSPITAL_BASED_OUTPATIENT_CLINIC_OR_DEPARTMENT_OTHER): Payer: Self-pay | Admitting: Infectious Disease

## 2017-11-14 LAB — RPR QUANTITATIVE (SENDOUT): RPR Quantitative (Sendout): 1:1 {titer} — AB

## 2017-11-14 NOTE — Progress Notes (Signed)
I have personally discussed the case with the fellow during or immediately after the patient visit including review of history, physical exam, diagnosis, and treatment plan. I agree with the assessment and plan of care.

## 2017-11-19 LAB — REFERENCE LABORATORY TEST 1: Reference Lab: Result 1 (Sendout): REACTIVE

## 2017-12-06 ENCOUNTER — Other Ambulatory Visit: Payer: Self-pay

## 2018-02-10 ENCOUNTER — Encounter (HOSPITAL_BASED_OUTPATIENT_CLINIC_OR_DEPARTMENT_OTHER): Payer: Self-pay | Admitting: Infectious Disease

## 2018-02-11 ENCOUNTER — Telehealth (HOSPITAL_BASED_OUTPATIENT_CLINIC_OR_DEPARTMENT_OTHER): Payer: Self-pay | Admitting: Social Work

## 2018-02-11 NOTE — Telephone Encounter (Signed)
DOCUMENT TYPE: Social Work - Follow-up Note    PATIENT: Jarrold, Kohlman   MRN: [id number]  DOB: 1990/12/01    ENCOUNTER DATE:  02/11/2018    ASSOCIATED PROGRAM-CLINIC:  [place]    CONTACT TYPE:  Telephone    BRIEF DESCRIPTION:  Health Insurance    PATIENT DESCRIPTION, PRESENTING PROBLEM, PATIENT / FAMILY GOALS:  This Child psychotherapist, who is covering the caseload of patient's primary HIV/AIDS case manager, [name], while she is away from clinic, received a message in which patient reported having problems with health insurance.    Patient's electronic case management record shows him as having Furniture conservator[employer]rest[employer] [facility]) / Medicaid coverage with [facility]) as his managed care provider and, on 12/24/2018, he had been referred by Ms. Karin Golden to [facility]) Customer Support, apparently to update his WHPF account.    In patient's message, he reported that he "called and renewed his insurance with a representative over the phone December 11th, and they also helped [him] set-up [his] online account because [he] was having problems with getting it registered." He further reported that he has 3 days of medication left and, when he called his pharmacy to get a refill, he was told that his insurance was not renewed, which has lead him to believe that the representative he called in December did not renew it. He is uncertain what to do and says he is traveling to [place] this morning and will not return until Sunday afternoon.    INTERVENTION / OUTCOME / PLAN:  1. This covering case manager called patient and, on obtaining patient's permission to partner to his WHPF account, did so. Review of patient's account showed him as currently having [facility] coverage with eligibility start and end dates of 01/14/2018 and 01/14/2019. Also, within the account was correspondence, dated 02/10/2018, entitled "Eligibility Results," confirming the aforementioned eligibility information. This covering  case manager, with the assistance of one of clinic's financial advocates, reviewed ProviderOne and noted patient as currently having Medicaid Alternative Benefit Plans (ABP).    2. Patient was provided with above information and he acknowledged having completed the application for [facility] last evening.    3. This covering case manager assisted patient with a conference call to [facility] in [facility] (telephone number: [phone]), where we spoke with pharmacy tech, Lurena Joiner. Lurena Joiner reported having tried to bill patient's [facility] without success and being told that patient's [facility] coverage had ended 01/13/18. Lurena Joiner was provided with patient's ProviderOne Client ID ([id number]) and she agreed to try to use it for the current billing for refill.    4. Follow-up call was received from [place]. She reported not having success using patient's ProviderOne Client ID. However, she plans to try again tomorrow since, in a call to Medicaid, she was instructed that it can take 48 to 72 hours, following a person's [facility] / Medicaid enrollment, for the "system" to allow billing.    5. This covering case manager relayed Rebecca's report to patient. It was agreed that he would follow-up with his pharmacy within the next day or two to check on whether they were successful in billing Medicaid. Patient was asked to keep this covering case manager updated and I told him that I would try to call [facility] on Friday as well.    DOCUMENT COMPOSED BY:   Cherlynn Kaiser, LICSW [phone] on 02/11/2018

## 2018-02-25 ENCOUNTER — Encounter (HOSPITAL_BASED_OUTPATIENT_CLINIC_OR_DEPARTMENT_OTHER): Payer: Self-pay | Admitting: Social Work

## 2018-02-25 NOTE — Progress Notes (Signed)
DOCUMENT TYPE: Social Work - Follow-up Note    PATIENT: Dennis [name], Dennis [name]   MRN: [id number]  DOB: 01/12/1991    ENCOUNTER DATE:  02/25/2018    ASSOCIATED PROGRAM-CLINIC:  [place]    CONTACT TYPE:  Collateral    BRIEF DESCRIPTION:  Notification from [facility]    PATIENT DESCRIPTION, PRESENTING PROBLEM, PATIENT / FAMILY GOALS:  As the current Navigator on this [facility] patient's [facility]) account, this social worker was copied on an email communication sent to patient. Patient is being instructed to sign into his WHPF account to view his "[employer] Information Request" notice.    Patient's primary HIV/AIDS case manager in our clinic is [employer].    INTERVENTION / OUTCOME / PLAN:  1. This social worker reviewed "[employer] Information Request" notice dated 02/24/2018 found in patient's WHPF account. The notice states that patient needs to provide proof of household income and deductions for at least the last 60 days by 03/12/2018. This information is needed to see if patient is eligible for [facility]). If the information is not received, patient's coverage may be stopped.    2. This Child psychotherapist forwarded the email communication to Ms. Karin Golden. I informed her of the contents of the notice and provided her with a printed copy of it so that she can provide follow-up as needed.    DOCUMENT COMPOSED BY:   Cherlynn Kaiser, LICSW [phone] on 02/25/2018

## 2018-03-06 ENCOUNTER — Other Ambulatory Visit (HOSPITAL_BASED_OUTPATIENT_CLINIC_OR_DEPARTMENT_OTHER): Payer: Self-pay | Admitting: Infectious Disease

## 2018-03-19 ENCOUNTER — Telehealth (HOSPITAL_BASED_OUTPATIENT_CLINIC_OR_DEPARTMENT_OTHER): Payer: Self-pay | Admitting: Counselor

## 2018-03-23 NOTE — Telephone Encounter (Signed)
DOCUMENT TYPE: Social Work - Follow-up Note    PATIENT: Dennis [name], Dennis [name]   MRN: [id number]  DOB: 1991-01-05    ENCOUNTER DATE:  03/19/2018    ASSOCIATED PROGRAM-CLINIC:  [place]    CONTACT TYPE:  Telephone    BRIEF DESCRIPTION:  Re: WHPF request/ WAH/ QHP/ Housing    PATIENT DESCRIPTION, PRESENTING PROBLEM, PATIENT / FAMILY GOALS:  28 y/o AA gay male with HIV, CD4 count 412, VL undetectable on HAART meds established HIV care and case management at [facility] clinic. Pt. is taking Genvoya. Pt. diagnosed with HIV on 03/21/2014 previous provider was Dr. Staci Righter at [place], [place]. Pt. lives with roommate and requested assistance with low-income housing program. Pt. is on [facility] for medical coverage; WHPF requested 60 days income verification.    INTERVENTION / OUTCOME / PLAN:  SW called pt. and explained WHPF request re: income verification. Explained about income guidelines to [facility] and QHP. Pt. agreed to bring 2 mo. of pay stubs; based on income writer will assist with QHP. Checked pt. if he connect with [facility] program. Pt. said he did not call to set up appointment. Motivated pt. to call and make appt for housing assistance.     Plan:  1. Assist pt. with medical insurance coverage.  2. Assist pt. with low income housing.    DOCUMENT COMPOSED BY:   [name], MSW [phone] on 03/23/2018

## 2018-03-30 ENCOUNTER — Encounter (HOSPITAL_BASED_OUTPATIENT_CLINIC_OR_DEPARTMENT_OTHER): Payer: Self-pay | Admitting: Counselor

## 2018-03-30 ENCOUNTER — Ambulatory Visit (HOSPITAL_BASED_OUTPATIENT_CLINIC_OR_DEPARTMENT_OTHER): Payer: No Typology Code available for payment source | Attending: Internal Medicine | Admitting: Infectious Disease

## 2018-03-30 ENCOUNTER — Telehealth (HOSPITAL_BASED_OUTPATIENT_CLINIC_OR_DEPARTMENT_OTHER): Payer: Self-pay | Admitting: Unknown Physician Specialty

## 2018-03-30 ENCOUNTER — Ambulatory Visit (HOSPITAL_BASED_OUTPATIENT_CLINIC_OR_DEPARTMENT_OTHER)
Admit: 2018-03-30 | Discharge: 2018-03-30 | Disposition: A | Discharge status: Home / Self Care | Payer: Medicaid Other | Attending: Oral and Maxillofacial Surgery | Admitting: Oral and Maxillofacial Surgery

## 2018-03-30 DIAGNOSIS — D709 Neutropenia, unspecified: Secondary | ICD-10-CM | POA: Insufficient documentation

## 2018-03-30 DIAGNOSIS — R4586 Emotional lability: Secondary | ICD-10-CM | POA: Insufficient documentation

## 2018-03-30 DIAGNOSIS — B20 Human immunodeficiency virus [HIV] disease: Secondary | ICD-10-CM | POA: Insufficient documentation

## 2018-03-30 DIAGNOSIS — Z21 Asymptomatic human immunodeficiency virus [HIV] infection status: Secondary | ICD-10-CM | POA: Insufficient documentation

## 2018-03-30 LAB — CBC, DIFF
% Basophils: 1 %
% Eosinophils: 2 %
% Immature Granulocytes: 0 %
% Lymphocytes: 57 %
% Monocytes: 14 %
% Neutrophils: 26 %
% Nucleated RBC: 0 %
Absolute Eosinophil Count: 0.08 10*3/uL (ref 0.00–0.50)
Absolute Lymphocyte Count: 1.97 10*3/uL (ref 1.00–4.80)
Basophils: 0.02 10*3/uL (ref 0.00–0.20)
Hematocrit: 41 % (ref 38–50)
Hemoglobin: 14.2 g/dL (ref 13.0–18.0)
Immature Granulocytes: 0.01 10*3/uL (ref 0.00–0.05)
MCH: 29 pg (ref 27.3–33.6)
MCHC: 34.9 g/dL (ref 32.2–36.5)
MCV: 83 fL (ref 81–98)
Monocytes: 0.49 10*3/uL (ref 0.00–0.80)
Neutrophils: 0.9 10*3/uL — ABNORMAL LOW (ref 1.80–7.00)
Nucleated RBC: 0 10*3/uL
Platelet Count: 218 10*3/uL (ref 150–400)
RBC: 4.9 10*6/uL (ref 4.40–5.60)
RDW-CV: 13.1 % (ref 11.6–14.4)
WBC: 3.47 10*3/uL — ABNORMAL LOW (ref 4.30–10.00)

## 2018-03-30 LAB — COMPREHENSIVE METABOLIC PANEL
ALT (GPT): 31 U/L (ref 10–64)
AST (GOT): 47 U/L — ABNORMAL HIGH (ref 9–38)
Albumin: 4.7 g/dL (ref 3.5–5.2)
Alkaline Phosphatase (Total): 86 U/L (ref 35–109)
Anion Gap: 7 (ref 4–12)
Bilirubin (Total): 0.4 mg/dL (ref 0.2–1.3)
Calcium: 9.6 mg/dL (ref 8.9–10.2)
Carbon Dioxide, Total: 29 meq/L (ref 22–32)
Chloride: 102 meq/L (ref 98–108)
Creatinine: 1.07 mg/dL (ref 0.51–1.18)
GFR, Calc, African American: >60 mL/min/{1.73_m2} (ref >59)
GFR, Calc, European American: >60 mL/min/{1.73_m2} (ref >59)
Glucose: 82 mg/dL (ref 62–125)
Potassium: 3.7 meq/L (ref 3.6–5.2)
Protein (Total): 7.8 g/dL (ref 6.0–8.2)
Sodium: 138 meq/L (ref 135–145)
Urea Nitrogen: 10 mg/dL (ref 8–21)

## 2018-03-30 MED ORDER — DOLUTEGRAVIR SODIUM 50 MG OR TABS
50.0000 mg | ORAL_TABLET | Freq: Every day | ORAL | 5 refills | Status: DC
Start: 2018-03-30 — End: 2018-08-31

## 2018-03-30 MED ORDER — DESCOVY 200-25 MG OR TABS
1.0000 | ORAL_TABLET | Freq: Every day | ORAL | 5 refills | Status: DC
Start: 2018-03-30 — End: 2018-08-31

## 2018-03-30 NOTE — Telephone Encounter (Signed)
Thao from [facility] called and reported ANC=0.9     The results was given to [facility] in person.

## 2018-03-30 NOTE — Progress Notes (Signed)
Dennis [name]  FOLLOW-UP VISIT  03/30/2018    ID/CC: Dennis [name] is a 28 year old man with well-controlled HIV here for routine visit.    ASSESSMENT/PLAN:   1. Asymptomatic HIV infection (HCC): History as below, currently CD4 412 in 07/2017 with UD VL since early 2018. He expressed desire to know updated CD4 count more regularly. We discussed current guidelines re: CD4 testing and landed on checking once per year as count is < 500. He will defer until later in the year to check again. Will get other safety labs and VL check with STI testing today. Refill ART, encouraged continued adherence.  - HIV1 RNA Quantitation  - Comprehensive Metabolic Panel  - RPR Quantitative  - GC and Chlamydia Nucleic Acid Testing  - GC and Chlamydia Nucleic Acid Testing  - GC and Chlamydia Nucleic Acid Testing  - CBC with Differential  - dolutegravir 50 MG tablet; Take 1 tablet (50 mg) by mouth daily.  Dispense: 30 tablet; Refill: 5  - emtricitabine-tenofovir alafenamide (Descovy) 200-25 MG tablet; Take 1 tablet by mouth daily.  Dispense: 30 tablet; Refill: 5    2. Neutropenia, unspecified type: Noted moderate neutropenia with ANC 900 today. He has been neutropenic in the past -- was through previously to be due to [facility] so was switchted to F/TAF + DTG. Neutropenia is listed as a potential AE for INSTIs, including DTG. It's noted that his ANC was normal in 12/2016 four months after switching to DTG so this could represent an unrelated cause or delayed effect of being on this INSTI. 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 No changes to ART for now. Will continue to monitor bi-annually and consider referral to hematology and/or ART change if ANC < 500.     3. Mood changes: Dennis [name] requests to see a therapist in the community. Did not elaborate on why beyond wanting to speak with someone about his mood. I notified SW Dennis [name] to discuss options with him. He is not interested in adjunctive pharmacotherapy.     RTC in 6  months  _______________________________________________________________________________________    HISTORY OF PRESENT ILLNESS: Frustrated with that fact that insurance almost lapsed, but things are back on track now. Otherwise Dennis [name] says things are going well. Still seeing the gentleman we talked about last year. No issues with that relationship. Taking ART as directed, no missed doses or AEs. Wonders if he can see a Veterinary surgeon to talk about his mood -- would prefer someone in the community.     REVIEW OF SYSTEMS:   Please see above.     PHYSICAL EXAMINATION:   Vital Signs: There were no vitals taken for this visit.  Constitutional: well-appearing, well-groomed, in no distress and cooperative  Neuro: oriented x3 and no focal deficits  Psych: mood is "ok", affect appropriate    HIV HISTORY:  HIV PCP: Dennis [name] --> Dennis [name]  Case Manager: Dennis [name]  Diagnosis Date: 03/2014 in [place]  Risk Factors for HIV: MSM  CD4 Nadir: Unk, CD4 404 in 05/2016  ARV/Resistance History: Unk resistance. Started Genvoya at dx --> DTG + FTC/TAC in 06/2016  HIV-Related Illnesses: None known  Viral Hepatitis: HAV immune 01/2016; HBsAb pos, sAg neg, cAb neg 01/2016; HCV NR 02/2017    I personally reviewed and verified the documented history with the patient and agree with it    HIV General Healthcare Maintenance  Cardiovascular/metabolic  Lipids: nl in 07/2017  HTN: nl screens at last 2 visits  DM: A1c nl 07/2017  UA: microhematuria in 07/2017. 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 influenza vaccine quadrivalent PF 02/08/2016, 10/10/2016   . pneumococcal (Pneumovax 23) polysaccharide vaccine 10/10/2016   . pneumococcal (Prevnar 13) conjugate vaccine 05/02/2016     Cancer screening -- n/a

## 2018-03-31 ENCOUNTER — Encounter (HOSPITAL_BASED_OUTPATIENT_CLINIC_OR_DEPARTMENT_OTHER): Payer: Self-pay | Admitting: Counselor

## 2018-03-31 ENCOUNTER — Encounter (HOSPITAL_BASED_OUTPATIENT_CLINIC_OR_DEPARTMENT_OTHER): Payer: Self-pay | Admitting: Infectious Disease

## 2018-03-31 LAB — GC&CHLAM NUCLEIC ACID DETECTN
Chlam Trachomatis Nucleic Acid: NEGATIVE
N.Gonorrhoeae(GC) Nucleic Acid: NEGATIVE

## 2018-04-01 ENCOUNTER — Encounter (HOSPITAL_BASED_OUTPATIENT_CLINIC_OR_DEPARTMENT_OTHER): Payer: Self-pay | Admitting: Counselor

## 2018-04-01 LAB — HIV1 RNA QUANTITATION: HIV RNA Result: NOT DETECTED {copies}/mL

## 2018-04-01 NOTE — Progress Notes (Signed)
DOCUMENT TYPE: Social Work - Full Assessment    PATIENT: Dennis [name], Dennis [name]   MRN: [id number]  DOB: 26-Nov-1990    ENCOUNTER DATE:  03/30/2018    ASSOCIATED PROGRAM-CLINIC:  [place]    CONTACT TYPE:  Face to Face - Office    BRIEF DESCRIPTION:  Assessment / WHPF-QHP/ EIP/ EHIP/ Charity/ MH/Service plan/ ROI/ Housing    PATIENT DESCRIPTION (AGE, GENDER, LANGUAGE, APPEARANCE, ETC.):  28 y/o AA gay male with HIV, on HAART meds CD4 count 412, VL undetectable, on HAART meds established HIV care and case management at [facility] clinic. Pt. diagnosed with HIV on 03/21/2014 previous provider Dr. Staci Righter at [place], [place]. His family does not know about his diagnosis, but some his friends know. Thinks that his ex-male partner gave him HIV. Does not have sex for money and does not do IV drugs. Pt. moved from [place] Jan 2016 and was taking Genvoya. Pt. lives with room-mate and would like to get to affordable housing program. Pt. lives at the address: [address] [place], [place] [address], DOB Jun 24, 1990 phone# [phone]. Pt. is on [facility] coverage and this medical coverage will terminate on 04/13/2017. Pt. is working at [employer] of [address] and with pay stubs to complete EIP, EHIP, [place] care and [address].    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 about safe sex practice, prevention and PrEP. Pt. is sexually active with HIV+ partner who is also  on HAART meds.    ADHERENCE TO HIV MEDS AND HIV APPOINTMENTS:  Pt. is taking HAART meds regularly.    LAST TOBACCO USE (DATE):  Denies using tobacco    DENTAL NEEDS:  Routine hyigene.    TRANSPORTATION TO CARE:  bus    INCOME, [place], VETERAN STATUS:  Pt. is on [facility], but this coverage will end on 04/14/2018. Pt. income increased above [facility] guidelines, monthly income about $3200/mo. SWR educated pt. about WHPF QHP, EIP and EHIP coverage. Assisted pt. to update income online WHPF. Pt. qualified for QHP and selected [employer] EPO 1500 Gold, begins on 04/15/2018. Writer assisted with EHIP form for premium payment $444.61 and tax credit $61.00. Consulted with [place] to assist pt. with EIP application, this form completed and will submit. SWR assisted with ART form, completed, signed and submitted. Charity application partially completed and will submit to assist pt. beginning 04/15/2018.    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    CULTURAL / RELIGIOUS FACTORS, SUPPORT SYSTEM:  Pt's mother and father alive and no known medical problem. Pt. has 2 sisters ages 69 and 53. Pt. did not disclose HIV status to his family.    LIVING ARRANGEMENT:  Pt. lives in apartment at the address:    [id number]  [place], [place] [address]  Pt's phone# [phone]    MENTAL HEALTH HISTORY (INPATIENT/OUTPATIENT TREATMENT HISTORY, MEDICATIONS, SUICIDAL IDEATION/ATTEMPTS, AGENCIES INVOLVED):  Pt. requested community mental health referral. . Pt. said he would like to see MH out of [facility] clinic. SWR provided pt. with [facility] and [facility] resources.    SUBSTANCE ABUSE (DRUGS OF CHOICE, PATTERN OF USE, INPATIENT/OUTPATIENT TREATMENT HISTORY):  Occasionally smokes MJ 2X a week.    LEGAL ISSUES: CRIMINAL AND IMMIGRATION:  None    INTERVENTION / PLAN FOR FOLLOW-UP:  1. Assisted pt. with WHPF income update and enroll to  QHP/ [employer] EPO-1500 Gold.  2. SWR referred pt. to [facility] to assist with housing issue.  3. Assist pt. EIP, EHIP and charity coverage.  4. Provided community mental health resources.   5. Review risk reduction for safe sex practice and prevention.  6. Support with adherence to HAART meds.  7. Service plan completed and signed.   8. Updated release of information.    DOCUMENT COMPOSED BY:   [name], MSW [phone] on 04/01/2018

## 2018-04-02 LAB — RPR QUANTITATIVE (SENDOUT): RPR Quantitative (Sendout): 1:1 {titer} — AB

## 2018-04-03 LAB — REFERENCE LABORATORY TEST 1: Reference Lab: Result 1 (Sendout): POSITIVE

## 2018-04-06 NOTE — Progress Notes (Signed)
DOCUMENT TYPE: Social Work - Follow-up Note    PATIENT: Dennis [name], Dennis [name]   MRN: [id number]  DOB: [birth year]    ENCOUNTER DATE:  04/01/2018    ASSOCIATED PROGRAM-CLINIC:  [facility]    CONTACT TYPE:  Collateral    BRIEF DESCRIPTION:  RE: EIP/ EHIP/ Charity submitted    PATIENT DESCRIPTION, PRESENTING PROBLEM, PATIENT / FAMILY GOALS:  28 y/o AA gay male with HIV, on HAART meds CD4 count 412, VL undetectable, on HAART meds established HIV care and case management at [facility]. Pt. diagnosed with HIV on 03/21/2014 previous provider Dr. Staci Righter at [place], [place]. His family does not know about his diagnosis, but some his friends know. Thinks that his ex-male partner gave him HIV. Does not have sex for money and does not do IV drugs. Pt. moved from [place] Jan 2016 and was taking Genvoya. Pt. lives with room-mate and would like to get to affordable housing program. Pt. lives at the address: [address] [place], [place] [address], DOB [birth year] phone# [phone]. Pt. is on [facility] coverage and this medical coverage will terminate on 04/13/2017. Pt. is working at [employer]. Pt. enrolled to [facility] through online [facility] and begins on 04/15/2018.    INTERVENTION / OUTCOME / PLAN:  SWR completed EHIP new enrollment form and passed to Stay/William. Clinical research associate also e-mailed to [employer] insurance advocate. Charity care application completed and submitted with notes that QHP and EIP begins on 04/15/2018.     Plan:  1. Assist pt. with medical insurance coverage.    DOCUMENT COMPOSED BY:   [name], MSW [phone] on 04/06/2018

## 2018-04-08 ENCOUNTER — Encounter (HOSPITAL_BASED_OUTPATIENT_CLINIC_OR_DEPARTMENT_OTHER): Payer: Self-pay | Admitting: Counselor

## 2018-04-13 NOTE — Progress Notes (Signed)
DOCUMENT TYPE: Social Work - Follow-up Note    PATIENT: Dennis [name], Dennis [name]   MRN: [id number]  DOB: [birth year]    ENCOUNTER DATE:  03/31/2018    ASSOCIATED PROGRAM-CLINIC:  [facility]    CONTACT TYPE:  Collateral    BRIEF DESCRIPTION:  EHIP CM request    PATIENT DESCRIPTION, PRESENTING PROBLEM, PATIENT / FAMILY GOALS:  28 y/o AA gay male with HIV, on HAART meds CD4 count 412, VL undetectable, on HAART meds established HIV care and case management at [facility] clinic. Pt. diagnosed with HIV on 03/21/2014 previous provider Dr. Staci Righter at [place], [place]. His family does not know about his diagnosis, but some his friends know. Thinks that his ex-male partner gave him HIV. Does not have sex for money and does not do IV drugs. Pt. moved from [place] Jan 2016 and was taking Genvoya. Pt. lives with room-mate and would like to get to affordable housing program. Pt. lives at the address: [address] [place], [place] [address], DOB November 18, 1990 phone# [phone]. Pt. is on [facility] coverage and this medical coverage will terminate on 04/13/2017. Pt. is working at [employer] of [address] and with pay stubs to complete EIP, EHIP, [place] care and [address].    INTERVENTION / OUTCOME / PLAN:  By Julious Payer request writer re-faxed new EHIP form for premium payment.     Plan:  1. Assist pt. with QHP premium payment.    DOCUMENT COMPOSED BY:   [name], MSW [phone] on 04/13/2018

## 2018-04-13 NOTE — Progress Notes (Signed)
DOCUMENT TYPE: Social Work - Follow-up Note    PATIENT: Dennis [name], Dennis [name]   MRN: [id number]  DOB: [birth year]    ENCOUNTER DATE:  04/08/2018    ASSOCIATED PROGRAM-CLINIC:  [facility]    CONTACT TYPE:  Collateral    BRIEF DESCRIPTION:  EHIP appli processed    PATIENT DESCRIPTION, PRESENTING PROBLEM, PATIENT / FAMILY GOALS:  28 y/o AA gay male with HIV, on HAART meds CD4 count 412, VL undetectable, on HAART meds established HIV care and case management at [facility] clinic. Pt. diagnosed with HIV on 03/21/2014 previous provider Dr. Staci Righter at [place], [place]. His family does not know about his diagnosis, but some his friends know. Thinks that his ex-male partner gave him HIV. Does not have sex for money and does not do IV drugs. Pt. moved from [place] Jan 2016 and was taking Genvoya. Pt. lives with room-mate and would like to get to affordable housing program. Pt. lives at the address: [address] [place], [place] [address], DOB May 07, 1990 phone# [phone]. Pt. is on [facility] coverage and this medical coverage will terminate on 04/13/2017. Pt. is working at [employer]. Pt. enrolled to [facility] through online [facility] and begins on 04/15/2018.    INTERVENTION / OUTCOME / PLAN:  SWR received e-mail from [facility] insurance advocate that application processed. Pt. is active on QHP beginning 4/1, EHIP and EIP coverage.     Plan:  1. Assist pt. with medical insurance coverage.    DOCUMENT COMPOSED BY:   [name], MSW [phone] on 04/13/2018

## 2018-04-13 NOTE — Progress Notes (Signed)
I have personally discussed the case with the fellow during or immediately after the patient visit including review of history, physical exam, diagnosis, and treatment plan. I agree with the assessment and plan of care.

## 2018-04-14 ENCOUNTER — Ambulatory Visit (HOSPITAL_BASED_OUTPATIENT_CLINIC_OR_DEPARTMENT_OTHER)
Admit: 2018-04-14 | Discharge: 2018-04-14 | Disposition: A | Discharge status: Home / Self Care | Payer: Medicaid Other | Attending: Oral and Maxillofacial Surgery | Admitting: Oral and Maxillofacial Surgery

## 2018-04-14 DIAGNOSIS — B20 Human immunodeficiency virus [HIV] disease: Secondary | ICD-10-CM | POA: Insufficient documentation

## 2018-04-20 ENCOUNTER — Encounter (HOSPITAL_BASED_OUTPATIENT_CLINIC_OR_DEPARTMENT_OTHER): Payer: Self-pay | Admitting: Counselor

## 2018-04-20 ENCOUNTER — Encounter (HOSPITAL_BASED_OUTPATIENT_CLINIC_OR_DEPARTMENT_OTHER): Payer: Self-pay | Admitting: Infectious Disease

## 2018-04-20 NOTE — Telephone Encounter (Signed)
Routing to SW and [facility] billing specialist to resolve

## 2018-05-12 ENCOUNTER — Encounter (HOSPITAL_BASED_OUTPATIENT_CLINIC_OR_DEPARTMENT_OTHER): Payer: Self-pay | Admitting: Counselor

## 2018-05-19 ENCOUNTER — Encounter (HOSPITAL_BASED_OUTPATIENT_CLINIC_OR_DEPARTMENT_OTHER): Payer: Self-pay | Admitting: Counselor

## 2018-05-19 ENCOUNTER — Encounter (HOSPITAL_BASED_OUTPATIENT_CLINIC_OR_DEPARTMENT_OTHER): Payer: Self-pay | Admitting: Infectious Disease

## 2018-05-21 ENCOUNTER — Telehealth (HOSPITAL_BASED_OUTPATIENT_CLINIC_OR_DEPARTMENT_OTHER): Payer: Self-pay | Admitting: Counselor

## 2018-05-21 ENCOUNTER — Encounter (HOSPITAL_BASED_OUTPATIENT_CLINIC_OR_DEPARTMENT_OTHER): Payer: Self-pay | Admitting: Counselor

## 2018-05-21 ENCOUNTER — Other Ambulatory Visit (HOSPITAL_BASED_OUTPATIENT_CLINIC_OR_DEPARTMENT_OTHER): Payer: Self-pay | Admitting: Counselor

## 2018-05-22 ENCOUNTER — Encounter (HOSPITAL_BASED_OUTPATIENT_CLINIC_OR_DEPARTMENT_OTHER): Payer: Self-pay | Admitting: Counselor

## 2018-05-22 ENCOUNTER — Telehealth (HOSPITAL_BASED_OUTPATIENT_CLINIC_OR_DEPARTMENT_OTHER): Payer: Self-pay | Admitting: Counselor

## 2018-05-25 ENCOUNTER — Telehealth (HOSPITAL_BASED_OUTPATIENT_CLINIC_OR_DEPARTMENT_OTHER): Payer: Self-pay | Admitting: Counselor

## 2018-05-25 NOTE — Progress Notes (Signed)
DOCUMENT TYPE: Social Work - Follow-up Note    PATIENT: Dennis [name], Dennis [name]   MRN: [id number]  DOB: 03-Feb-1990    ENCOUNTER DATE:  05/22/2018    ASSOCIATED PROGRAM-CLINIC:  [facility]    CONTACT TYPE:  Collateral    BRIEF DESCRIPTION:  Re: WHPF verbal consent    PATIENT DESCRIPTION, PRESENTING PROBLEM, PATIENT / FAMILY GOALS:  28 y/o AA gay male with HIV, on HAART meds CD4 count 412, VL undetectable, on HAART meds established HIV care and case management at [facility] clinic. Pt. diagnosed with HIV on 03/21/2014 previous provider Dr. Staci Righter at [place], [place]. His family does not know about his diagnosis, but some his friends know. Thinks that his ex-male partner gave him HIV. Does not have sex for money and does not do IV drugs. Pt. moved from [place] Jan 2016 and was taking Genvoya. Pt. lives with room-mate and would like to get to affordable housing program. Pt. lives at the address: [address] [place], [place] [address], DOB [birth year] phone# [phone]. Pt. is on [facility] coverage and this medical coverage will terminate on 04/13/2017. Pt. is working at [employer]. Pt. enrolled to [facility] through online [facility] and begins on 04/15/2018. However, WHPF shows interruption of QHP and requested upload declaration of no coverage, QHP will start 6/1. RE: Verbal consent.    INTERVENTION / OUTCOME / PLAN:  SWR received e-mail from Burton Covert that [facility] accepts verbal consent. Writer to complete form and call pt. for verbal consent.     Plan:  1. Assist pt. with WHPF request.    DOCUMENT COMPOSED BY:   [name], MSW [phone] on 05/25/2018

## 2018-05-25 NOTE — Progress Notes (Signed)
DOCUMENT TYPE: Social Work - Follow-up Note    PATIENT: Dennis [name], Dennis [name]   MRN: [id number]  DOB: 08-Jun-1990    ENCOUNTER DATE:  05/21/2018    ASSOCIATED PROGRAM-CLINIC:  [facility]    CONTACT TYPE:  Collateral    BRIEF DESCRIPTION:  RE: Medical insurance/ WHPF request/ EIP    PATIENT DESCRIPTION, PRESENTING PROBLEM, PATIENT / FAMILY GOALS:  28 y/o AA gay male with HIV, on HAART meds CD4 count 412, VL undetectable, on HAART meds established HIV care and case management at [facility] clinic. Pt. diagnosed with HIV on 03/21/2014 previous provider Dr. Staci Righter at [place], [place]. His family does not know about his diagnosis, but some his friends know. Thinks that his ex-male partner gave him HIV. Does not have sex for money and does not do IV drugs. Pt. moved from [place] Jan 2016 and was taking Genvoya. Pt. lives with room-mate and would like to get to affordable housing program. Pt. lives at the address: [address] [place], [place] [address], DOB 08-18-1990 phone# [phone]. Pt. is on [facility] coverage and this medical coverage will terminate on 04/13/2017. Pt. is working at [employer]. Pt. enrolled to [facility] through online [facility] and begins on 04/15/2018.    INTERVENTION / OUTCOME / PLAN:  SWR received epic e-mail from PCP that pt. has no coverage. Writer checked online WHPF re: QHP coverage and this coverage terminated for this month. SWR found that [facility] sent letter to pt. requesting to verify no-other coverage. Writer consulted with [facility] about urgent transfer to EIP grp#3. E-mail sent to [facility] to move pt. to EIP grp#3 to assist pt. with meds refill.     Plan:  1. Assist pt. with medical insurance coverage.  2. Transfer to EIP grp#3.  3. Assist pt. with WHPF request.    DOCUMENT COMPOSED BY:   [name], MSW [phone] on 05/25/2018

## 2018-05-25 NOTE — Telephone Encounter (Signed)
DOCUMENT TYPE: Social Work - Follow-up Note    PATIENT: Dennis [name], Dennis [name]   MRN: [id number]  DOB: [birth year]    ENCOUNTER DATE:  05/21/2018    ASSOCIATED PROGRAM-CLINIC:  [facility]    CONTACT TYPE:  Telephone    BRIEF DESCRIPTION:  RE: WHPF Request/ verbal consent     PATIENT DESCRIPTION, PRESENTING PROBLEM, PATIENT / FAMILY GOALS:  28 y/o AA gay male with HIV, on HAART meds CD4 count 412, VL undetectable, on HAART meds established HIV care and case management at [facility] clinic. Pt. diagnosed with HIV on 03/21/2014 previous provider Dr. Staci Righter at [place], [place]. His family does not know about his diagnosis, but some his friends know. Thinks that his ex-male partner gave him HIV. Does not have sex for money and does not do IV drugs. Pt. moved from [place] Jan 2016 and was taking Genvoya. Pt. lives with room-mate and would like to get to affordable housing program. Pt. lives at the address: [address] [place], [place] [address], DOB [birth year] phone# [phone]. Pt. is on [facility] coverage and this medical coverage will terminate on 04/13/2017. Pt. is working at [employer]. Pt. enrolled to [facility] through online [facility] and begins on 04/15/2018. However, WHPF shows interruption of QHP and requested upload declaration of no coverage, QHP will start 6/1.    INTERVENTION / OUTCOME / PLAN:  SWR called pt. about WHPF request and letters. Pt. said he did now know about letters. Writer explained WHPF request re: declaration of ineligibility for other health insurance coverage: Medicare, Tricare and [employer]. Pt. would like writer to complete with verbal consent. Writer e-mailed [facility] re: using verbal consent.     Plan:  1. Assist pt. with medical coverage.    DOCUMENT COMPOSED BY:   [name], MSW [phone] on 05/25/2018

## 2018-05-25 NOTE — Telephone Encounter (Signed)
DOCUMENT TYPE: Social Work - Follow-up Note    PATIENT: Dennis [name], Dennis [name]   MRN: [id number]  DOB: 10/17/1990    ENCOUNTER DATE:  05/22/2018    ASSOCIATED PROGRAM-CLINIC:  [facility]    CONTACT TYPE:  Telephone    BRIEF DESCRIPTION:  WHPF request/ Scan and upload/ Verbal consent    PATIENT DESCRIPTION, PRESENTING PROBLEM, PATIENT / FAMILY GOALS:  28 y/o AA gay male with HIV, on HAART meds CD4 count 412, VL undetectable, on HAART meds established HIV care and case management at [facility] clinic. Pt. diagnosed with HIV on 03/21/2014 previous provider Dr. Staci Righter at [place], [place]. His family does not know about his diagnosis, but some his friends know. Thinks that his ex-male partner gave him HIV. Does not have sex for money and does not do IV drugs. Pt. moved from [place] Jan 2016 and was taking Genvoya. Pt. lives with room-mate and would like to get to affordable housing program. Pt. lives at the address: [address] [place], [place] [address], DOB Oct 29, 1990 phone# [phone]. Pt. is on [facility] coverage and this medical coverage will terminate on 04/13/2017. Pt. is working at [employer]. Pt. enrolled to [facility] through online [facility] and begins on 04/15/2018. However, WHPF shows interruption of QHP and requested upload declaration of no coverage and Verbal consent.    INTERVENTION / OUTCOME / PLAN:  SWR communicated with pt. about re: declaration of ineligibility for other health insurance coverage completed this document and pt. agreed with verbal consent for signature. Writer scanned this document and uploaded to [facility] account. Will make f/u on WHPF for QHP coverage. Writer e-mailed to [employer] advocate.     Plan:  1. Assist pt. with WHPF request.   2. Assist pt. with medical insurance coverage and premium / EHIP.    DOCUMENT COMPOSED BY:   [name], MSW [phone] on 05/25/2018

## 2018-05-27 NOTE — Telephone Encounter (Signed)
DOCUMENT TYPE: Social Work - Follow-up Note    PATIENT: Dennis [name], Dennis [name]   MRN: [id number]  DOB: 04/14/1990    ENCOUNTER DATE:  05/27/2018    ASSOCIATED PROGRAM-CLINIC:  [facility]    CONTACT TYPE:  Telephone    BRIEF DESCRIPTION:  RE: Meds refill/ WHPF/ QHP completed/ EIP/ EHIP    PATIENT DESCRIPTION, PRESENTING PROBLEM, PATIENT / FAMILY GOALS:  28 y/o AA gay male with HIV, on HAART meds CD4 count 412, VL undetectable, on HAART meds established HIV care and case management at [facility] clinic. Pt. diagnosed with HIV on 03/21/2014 previous provider Dr. Staci Righter at [place], [place]. His family does not know about his diagnosis, but some his friends know. Thinks that his ex-male partner gave him HIV. Does not have sex for money and does not do IV drugs. Pt. moved from [place] Jan 2016 and was taking Genvoya. Pt. lives with room-mate and would like to get to affordable housing program. Pt. lives at the address: [address] [place], [place] [address], DOB Jul 07, 1990 phone# [phone]. Pt. is on [facility] coverage and this medical coverage will terminate on 04/13/2017. Pt. is working at [employer]. Pt. enrolled to [facility] through online [facility] and begins on 04/15/2018. However, WHPF shows interruption of QHP and requested upload declaration of no coverage and Verbal consent. Pt. called having problem with meds refill at the local pharmacy.    INTERVENTION / OUTCOME / PLAN:  SWR communicated with local pharmacist to bill through pt's insurance but shows that EIP is still grp1. Writer checked WHPF online for changes and learned that WHPF opened up to shop coverage. SWR completed this application and insurance reinstated back to 04/15/2018. Due to this pt. become active on [employer]. SWR provided this information and grp# to pharmacist and the bill went through with no problem. Pt. received his monthly HIV meds and appreciated with this assistance. Writer called [employer] advocate and left message. Pt.  is back on EIP grp1. Pt. agreed to Higher education careers adviser when he receives letters from [facility].     Plan:  1. Assist pt. with meds refill.  2. Assist pt. with medical insurance coverage.    DOCUMENT COMPOSED BY:   [name], MSW [phone] on 05/27/2018

## 2018-06-02 ENCOUNTER — Encounter (HOSPITAL_BASED_OUTPATIENT_CLINIC_OR_DEPARTMENT_OTHER): Payer: Self-pay | Admitting: Counselor

## 2018-08-14 ENCOUNTER — Encounter (HOSPITAL_BASED_OUTPATIENT_CLINIC_OR_DEPARTMENT_OTHER): Payer: Self-pay | Admitting: Counselor

## 2018-08-19 ENCOUNTER — Other Ambulatory Visit (HOSPITAL_BASED_OUTPATIENT_CLINIC_OR_DEPARTMENT_OTHER): Payer: Self-pay | Admitting: Infectious Disease

## 2018-08-24 ENCOUNTER — Encounter (HOSPITAL_BASED_OUTPATIENT_CLINIC_OR_DEPARTMENT_OTHER): Payer: Self-pay | Admitting: Counselor

## 2018-08-30 DIAGNOSIS — D708 Other neutropenia: Secondary | ICD-10-CM | POA: Insufficient documentation

## 2018-08-31 ENCOUNTER — Ambulatory Visit (HOSPITAL_BASED_OUTPATIENT_CLINIC_OR_DEPARTMENT_OTHER): Payer: No Typology Code available for payment source | Attending: Infectious Disease | Admitting: Infectious Disease

## 2018-08-31 ENCOUNTER — Encounter (HOSPITAL_BASED_OUTPATIENT_CLINIC_OR_DEPARTMENT_OTHER): Payer: Self-pay | Admitting: Infectious Disease

## 2018-08-31 ENCOUNTER — Encounter (HOSPITAL_BASED_OUTPATIENT_CLINIC_OR_DEPARTMENT_OTHER): Payer: Self-pay | Admitting: Social Work

## 2018-08-31 VITALS — BP 114/72 | HR 70 | Temp 97.9°F | Resp 16 | Wt 194.0 lb

## 2018-08-31 DIAGNOSIS — D708 Other neutropenia: Secondary | ICD-10-CM

## 2018-08-31 DIAGNOSIS — R1011 Right upper quadrant pain: Secondary | ICD-10-CM

## 2018-08-31 DIAGNOSIS — B20 Human immunodeficiency virus [HIV] disease: Secondary | ICD-10-CM

## 2018-08-31 DIAGNOSIS — Z21 Asymptomatic human immunodeficiency virus [HIV] infection status: Secondary | ICD-10-CM

## 2018-08-31 LAB — COMPREHENSIVE METABOLIC PANEL
ALT (GPT): 19 U/L (ref 10–64)
AST (GOT): 13 U/L (ref 9–38)
Albumin: 4.5 g/dL (ref 3.5–5.2)
Alkaline Phosphatase (Total): 72 U/L (ref 35–109)
Anion Gap: 6 (ref 4–12)
Bilirubin (Total): 0.5 mg/dL (ref 0.2–1.3)
Calcium: 9.7 mg/dL (ref 8.9–10.2)
Carbon Dioxide, Total: 28 meq/L (ref 22–32)
Chloride: 102 meq/L (ref 98–108)
Creatinine: 1.24 mg/dL — ABNORMAL HIGH (ref 0.51–1.18)
Glucose: 87 mg/dL (ref 62–125)
Potassium: 4.1 meq/L (ref 3.6–5.2)
Protein (Total): 7.5 g/dL (ref 6.0–8.2)
Sodium: 136 meq/L (ref 135–145)
Urea Nitrogen: 12 mg/dL (ref 8–21)
eGFR by CKD-EPI: >60 mL/min/{1.73_m2} (ref >59)

## 2018-08-31 LAB — CBC, DIFF
% Basophils: 0 %
% Eosinophils: 1 %
% Immature Granulocytes: 0 %
% Lymphocytes: 58 %
% Monocytes: 13 %
% Neutrophils: 28 %
% Nucleated RBC: 0 %
Absolute Eosinophil Count: 0.04 10*3/uL (ref 0.00–0.50)
Absolute Lymphocyte Count: 1.82 10*3/uL (ref 1.00–4.80)
Basophils: 0.01 10*3/uL (ref 0.00–0.20)
Hematocrit: 40 % (ref 38–50)
Hemoglobin: 13.8 g/dL (ref 13.0–18.0)
Immature Granulocytes: 0.01 10*3/uL (ref 0.00–0.05)
MCH: 28.9 pg (ref 27.3–33.6)
MCHC: 34.8 g/dL (ref 32.2–36.5)
MCV: 83 fL (ref 81–98)
Monocytes: 0.42 10*3/uL (ref 0.00–0.80)
Neutrophils: 0.89 10*3/uL — ABNORMAL LOW (ref 1.80–7.00)
Nucleated RBC: 0 10*3/uL
Platelet Count: 210 10*3/uL (ref 150–400)
RBC: 4.77 10*6/uL (ref 4.40–5.60)
RDW-CV: 12.7 % (ref 11.6–14.4)
WBC: 3.19 10*3/uL — ABNORMAL LOW (ref 4.30–10.00)

## 2018-08-31 MED ORDER — DESCOVY 200-25 MG OR TABS
1.0000 | ORAL_TABLET | Freq: Every day | ORAL | 5 refills | Status: DC
Start: 2018-08-31 — End: 2019-02-08

## 2018-08-31 MED ORDER — DOLUTEGRAVIR SODIUM 50 MG OR TABS
50.0000 mg | ORAL_TABLET | Freq: Every day | ORAL | 5 refills | Status: DC
Start: 2018-08-31 — End: 2019-02-08

## 2018-08-31 NOTE — Progress Notes (Signed)
[facility]  FOLLOW-UP VISIT  08/31/2018    ID/CC: Dennis [name] is a 28 year old man with well-controlled HIV presenting with c/o abd pain.    ASSESSMENT/PLAN:   1. 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 Refer to heme-onc or discuss further workup if ANC dropping or < 500.   - CBC with Differential    2. HIV disease (HCC): History as below, currently CD4 up to 612 with UD VL since early 2018. Will get other safety labs and VL check with STI testing today. Refill ART, encouraged continued adherence.  - HIV1 RNA Quantitation  - T cell subsets CD4/CD8 Only Counts  - Comprehensive Metabolic Panel  - RPR Quantitative  - GC and Chlamydia Nucleic Acid Testing  - GC and Chlamydia Nucleic Acid Testing  - GC and Chlamydia Nucleic Acid Testing  - dolutegravir 50 MG tablet; Take 1 tablet (50 mg) by mouth daily.  Dispense: 30 tablet; Refill: 5  - emtricitabine-tenofovir alafenamide (Descovy) 200-25 MG tablet; Take 1 tablet by mouth daily.  Dispense: 30 tablet; Refill: 5    3. Right upper quadrant abdominal pain: Unclear etiology -- GERD related or mild PUD, cholelithiasis? Discussed prior AST was elevated, will repeat today for this and bili. Will continue to monitor sx for now. He declined trial of H2B. If persistent or CMP abnormal will proceed with RUQ sono.     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 It's a long commute for [place] from the [place]. 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 Reports adherence to ART with no issues. Asks me to assist filling out paper work for [facility].    Offers that his abd pain is still persistent -- intermittently so, comes on suddenly during middday and lasts for about 2 days usually. He's wondered if it was just  hunger pangs, but doesn't improve with eating. Not associated with n/v/d, particular foods or drinks, doesn't come on early in the morning and no acrid taste in his mouth. No prior h/o GERD. +EtOH occasionally. Not taking any new supplements or vitamins, OTC medications. No hematemesis or melena. Denies recent trauma or injury to chest wall.    REVIEW OF SYSTEMS:   Please see above.     PHYSICAL EXAMINATION:   Vital Signs: Blood pressure 114/72, pulse 70, temperature 97.9 F (36.6 C), temperature source Temporal, resp. rate 16, weight 194 lb (88 kg), SpO2 96 %.  Constitutional: well-appearing, well-groomed, in no distress, well-nourished and cooperative  Eyes: anicteric, EOMI and conjunctivae clear  Lungs: clear to auscultation and unlabored breathing  Heart: regular rate and rhythm and no edema   Skin: warm to touch and no lesions noted  GI: soft abd, no HSM, mild tenderness at subcostal border on R, no masses    HIV HISTORY:  HIV ZOX:WRUEAVPCP:[name] --> Alan Riles  Case Manager:[name] Duressa  Diagnosis Date:03/2014 in [place]  Risk Factors for HIV:MSM  CD4 Nadir:Unk, CD4 404 in 05/2016  ARV/Resistance History:Unk resistance. Started Genvoya at dx --> DTG + FTC/TAC in 06/2016  HIV-Related Illnesses:None known  Viral Hepatitis:HAV immune 01/2016; HBsAb pos, sAg neg, cAb neg 01/2016; HCV NR 02/2017    I personally reviewed and verified the documented history with the patient andagree with it.    HIV General Healthcare  Maintenance    Cardiovascular/metabolic  Lipids: nl in 0/3709  HTN: nl screens at last 3 visits  DM: A1c nl 07/2017  UA: microhematuria in 07/2017. Resolved on repeat UA 10/2017    Sexually transmitted infections  Syphilis: serofast RPR 1:4 in 02/2017 after tx for early syphilis in 01/2016 (initial RPR 1:32) --> 1:1 in 10/2017 (likely new serofast titer) --> 1:1 in 03/2018  -- recheck today  GC/CT: neg screen x3 in 10/2017 -- recheck today    Immunizations/Serologies  Quantiferon neg 01/2016  Toxo neg  01/2016  Declined flu, Menveo and HPV vaccines in past -- not discussed today

## 2018-09-01 ENCOUNTER — Telehealth (HOSPITAL_BASED_OUTPATIENT_CLINIC_OR_DEPARTMENT_OTHER): Payer: Self-pay | Admitting: Social Work

## 2018-09-01 LAB — GC&CHLAM NUCLEIC ACID DETECTN
Chlam Trachomatis Nucleic Acid: NEGATIVE
Chlam Trachomatis Nucleic Acid: NEGATIVE
Chlam Trachomatis Nucleic Acid: NEGATIVE
N.Gonorrhoeae(GC) Nucleic Acid: NEGATIVE
N.Gonorrhoeae(GC) Nucleic Acid: NEGATIVE
N.Gonorrhoeae(GC) Nucleic Acid: NEGATIVE

## 2018-09-01 LAB — T CELL SUBSETS CD4/CD8 ONLY COUNTS
% CD4: 36 % (ref 33–61)
% CD8: 32 % (ref 14–35)
Abs CD4 Lymphocyte Cnt: 0.612 10*3/uL — ABNORMAL LOW (ref 0.730–2.250)
Abs CD8 Lymphocyte Cnt: 0.544 10*3/uL (ref 0.250–1.240)
CD4/CD8 Ratio: 1.12 (ref 1.00–3.78)

## 2018-09-01 NOTE — Telephone Encounter (Signed)
DOCUMENT TYPE: Social Work - Follow-up Note    PATIENT: Dennis [name], Dennis [name]   MRN: [id number]  DOB: [birth year]    ENCOUNTER DATE:  09/01/2018    ASSOCIATED PROGRAM-CLINIC:  [facility]    CONTACT TYPE:  Telephone    BRIEF DESCRIPTION:  F/U RE: Dental Care; Funding for Health Care    PATIENT DESCRIPTION, PRESENTING PROBLEM, PATIENT / FAMILY GOALS:  Yesterday, paperwork to refer this [facility] patient to [place] (LDP) was started.     Patient's current funding for his health care is reported to be a [employer] (QHP), accessed through the [facility]), along with EIP enrollment to supplement it. Patient's medical record does not currently contain information about patient's [employer] coverage.    Patient's primary HIV/AIDS case manager, Nicholes Calamity, is currently away from clinic until 09/07/2018.    INTERVENTION / OUTCOME / PLAN:  1. Paperwork needed to refer patient to LDP was completed and faxed to [facility] Referral and Enrollment (PRE) staff for review. In the process of completing referral, patient's average gross monthly income was calculated to be $3,045.00 using his most recent two months of earnings statements. A voicemail was left for PRE staff, Roma Schanz, alerting him to faxed referral paperwork and asking him to confirm its receipt.    2. Confirmation of patient's [employer] QHP was obtained using [employer]. [facility] front desk staff was provided with insurance information to enter into patient's medical record. Patient was informed of this.    3. Noting that patient might be eligible for [place] (FA) based on aforementioned income calculation, this covering case manager suggested to patient that a FA application be considered. Patient was provided with educational counseling about the program and he was asked to provide me with two additional earnings statements so that I could confirm income  eligibility.    4. Correspondence via email was discussed with patient and he sent me an email providing me with permission to email him. Patient was emailed a "Pooler for Email Correspondence" form to complete and email back.    DOCUMENT COMPOSED BY:   Newell Coral, [place] [phone] on 09/01/2018

## 2018-09-01 NOTE — Progress Notes (Signed)
DOCUMENT TYPE: Social Work - Follow-up Note    PATIENT: Dennis [name], Dennis [name]   MRN: [id number]  DOB: [birth year]    ENCOUNTER DATE:  08/31/2018    ASSOCIATED PROGRAM-CLINIC:  Madison    CONTACT TYPE:  Face to Face - Office    BRIEF DESCRIPTION:  Dental & Vision Care; Pt. Complaint; Funding for Health Care    PATIENT DESCRIPTION, PRESENTING PROBLEM, PATIENT / FAMILY GOALS:  While in clinic for follow-up appointment with his HIV primary care provider, Dr. Osvaldo Human, this [facility] patient was referred by our clinic's Early Intervention Program (EIP) Coordinator, Rip Harbour, to case management. Patient had expressed needs for dental and vision care, while working on renewal his enrollment with EIP, which until recent automatic extension, had been scheduled to expire on 10/14/2018. He was also reported to have complained about not having his calls returned by his primary HIV/AIDS case manager, Nicholes Calamity, who is currently away from clinic until 09/07/2018.    Patient's current funding for his health care is reported to be a [place] (QHP), accessed through the [facility]), along with EIP enrollment to supplement it. Patient's medical record does not currently contain information about patient's Premera coverage.    INTERVENTION / OUTCOME / PLAN:  1. This covering case manager met with patient in Ms. Duressa's absence. Patient was provided with educational counseling regarding Lifelong Dental Program (LDP) and the services it offers. LDP referral paperwork was worked on and the plan is to complete it and submit it to Lifelong Program Referral and Enrollment (PRE) staff tomorrow of their review. During the counseling process, patient's Premera QHP's lack of coverage for dental care was discussed.    2. Patient's current vision care needs and the limits of his Premera QHP's funding for vision was discussed. Patient reported wanting contacts and he was accepting of  the fact that this covering case manager was without a resource, such as LDP for his dental, in which to refer him for vision funding.    3. At patient's request, we discussed the process by which he can make a complaint regarding case management service. He was provided with the business card of [place] Work Supervisor, [employer].    4. This covering case manager plans to confirm patient's Premera coverage tomorrow and provide information to our clinic front desk staff so that it can be added to patient's medical record.    DOCUMENT COMPOSED BY:   Newell Coral, [place] [phone] on 09/01/2018

## 2018-09-02 ENCOUNTER — Encounter (HOSPITAL_BASED_OUTPATIENT_CLINIC_OR_DEPARTMENT_OTHER): Payer: Self-pay | Admitting: Infectious Disease

## 2018-09-02 ENCOUNTER — Encounter (HOSPITAL_BASED_OUTPATIENT_CLINIC_OR_DEPARTMENT_OTHER): Payer: Self-pay | Admitting: Social Work

## 2018-09-02 LAB — HIV1 RNA QUANTITATION
HIV RNA Copies/mL (Log10): <1.6 {Log_copies}/mL
HIV RNA Result: <40 {copies}/mL — AB

## 2018-09-02 NOTE — Progress Notes (Signed)
DOCUMENT TYPE: Social Work - Follow-up Note    PATIENT: Dennis [name], Dennis [name]   MRN: [id number]  DOB: 10-May-1990    ENCOUNTER DATE:  09/02/2018    ASSOCIATED PROGRAM-CLINIC:  Madison    CONTACT TYPE:  Email    BRIEF DESCRIPTION:  F/U RE: Dental Care; Funding for [place], PRESENTING PROBLEM, PATIENT / FAMILY GOALS:  Yesterday, paperwork to refer this [facility] patient to [place] (LDP) was completed and faxed to Lifelong Program Referral and Enrollment (PRE) staff for processing. Confirmation of fax was requested.    Also, so as to further assess patient's eligibility for [place] ([place]), patient was asked to provide two additional earnings statements so that his income could be verified in accordance to [employer] requirements. Patient was also asked to submit a completed "[place] for Email Correspondence" form so as to confirm, in writing, his permission to correspond by email.    Patient's primary HIV/AIDS case manager, Nicholes Calamity, is currently away from clinic until 09/07/2018.    INTERVENTION / OUTCOME / PLAN:  1. Confirmation of PRE staff's receipt of faxed referral paperwork was received. PRE staff, Roma Schanz, reported that he expects for referral to be processed by today.    2. Follow-up email communication was received from patient letting this covering case manager know that he had sent requested information by email. This covering case manager, in turn, responded informing patient that information had not been received. Patient was asked to resend.    DOCUMENT COMPOSED BY:   Newell Coral, [place] [phone] on 09/02/2018

## 2018-09-03 ENCOUNTER — Encounter (HOSPITAL_BASED_OUTPATIENT_CLINIC_OR_DEPARTMENT_OTHER): Payer: Self-pay | Admitting: Social Work

## 2018-09-03 NOTE — Progress Notes (Signed)
DOCUMENT TYPE: Social Work - Follow-up Note    PATIENT: Dennis [name], Dennis [name]   MRN: [id number]  DOB: [birth year]    ENCOUNTER DATE:  09/03/2018    ASSOCIATED PROGRAM-CLINIC:  Madison    CONTACT TYPE:  Email    BRIEF DESCRIPTION:  F/U RE: [place] [facility] FA    PATIENT DESCRIPTION, PRESENTING PROBLEM, PATIENT / FAMILY GOALS:  So as to assess this [facility] patient's eligibility for [place] ([place]), patient was asked to provide case management with two additional earnings statements. (He had already provided four to be submitted to Early Intervention Program.) Patient was also asked to submit a completed "[place] for Email Correspondence" form so as to confirm, in writing, the permission already provided to correspond by email.    Patient's primary HIV/AIDS case manager, Nicholes Calamity, is currently away from clinic until 09/07/2018.    INTERVENTION / OUTCOME / PLAN:  Follow-up communication was received from patient stating that he would follow-up as requested.    Ms. Roetta Sessions will be updated upon her return to clinic.    DOCUMENT COMPOSED BY:   Newell Coral, [place] [phone] on 09/03/2018

## 2018-09-08 ENCOUNTER — Encounter (HOSPITAL_BASED_OUTPATIENT_CLINIC_OR_DEPARTMENT_OTHER): Payer: Self-pay | Admitting: Social Work

## 2018-09-08 LAB — RPR QUANTITATIVE (SENDOUT): RPR Quantitative (Sendout): 1:1 {titer} — AB

## 2018-09-08 LAB — REFERENCE LABORATORY TEST 1: Reference Lab: Result 1 (Sendout): POSITIVE

## 2018-09-08 NOTE — Progress Notes (Signed)
DOCUMENT TYPE: Social Work - Follow-up Note    PATIENT: Dennis [name], Dennis [name]   MRN: [id number]  DOB: 09-24-1990    ENCOUNTER DATE:  09/08/2018    ASSOCIATED PROGRAM-CLINIC:  Madison    CONTACT TYPE:  Email    BRIEF DESCRIPTION:  [place] Medicine FA; Email Agreement    PATIENT DESCRIPTION, PRESENTING PROBLEM, PATIENT / FAMILY GOALS:  So as to assess this [employer] patient's eligibility for [place] ([place]), patient was asked to provide case management with two additional earnings statements. (He had already provided four to be submitted to Early Intervention Program.) Patient was also asked to submit a completed "[place] for Email Correspondence" form so as to confirm, in writing, the permission already provided to correspond by email.    Patient's primary HIV/AIDS case manager is [employer].    INTERVENTION / OUTCOME / PLAN:  1. Completed "[place] Medicine Agreement for Email Correspondence" form was received from patient. A copy was placed in patient's case management file and a copy is being sent to Medical Records to be scanned into his chart.    2. Two earnings statements were received from patient. One, with a pay date of 06/19/2018, had not previously been provided. But the other, with pay date of 07/06/2018, had. Email communication was sent to patient informing him of this and asking him if he could provide an earnings statement dated during the latter half of August, and if not, one that was dated during the latter half of May.    3. Using the unduplicated five earnings statements provided by the patient so far, this Education officer, museum calculated patient's average gross monthly income to be $3,157.13. Since the income limit for [place] enrollment is $3,190/month, it looks like patient may qualify. Patient was informed of this and told that, as long as the gross earnings reported on the last earnings statement, being asked for, is $6,761 or less, an application  for [facility] should be submitted. A copy of the application was attached to the email.     4. Patient was provided with educational counseling informing him of the benefit of being enrolled in [place], if one is eligible. He was also informed that it is a program for which one needs to apply every six months.    5. Ms. Roetta Sessions is being copied on this note so that she will be informed of the above.    DOCUMENT COMPOSED BY:   Newell Coral, [place] [phone] on 09/08/2018

## 2018-09-09 ENCOUNTER — Encounter (HOSPITAL_BASED_OUTPATIENT_CLINIC_OR_DEPARTMENT_OTHER): Payer: Self-pay | Admitting: Social Work

## 2018-09-09 NOTE — Progress Notes (Signed)
DOCUMENT TYPE: Social Work - Follow-up Note    PATIENT: Dennis [name], Dennis [name]   MRN: [id number]  DOB: [birth year]    ENCOUNTER DATE:  09/09/2018    ASSOCIATED PROGRAM-CLINIC:  Madison    CONTACT TYPE:  Email    BRIEF DESCRIPTION:  F/U RE: [place] Medicine FA    PATIENT DESCRIPTION, PRESENTING PROBLEM, PATIENT / FAMILY GOALS:  So as to assess this [facility] patient's eligibility for [place] ([place]), case management is in the process of obtaining the requisite three months of recent earnings statements from patient.    Patient's primary HIV/AIDS case manager in our clinic is [employer].    INTERVENTION / OUTCOME / PLAN:  1. The final of six earning statements was received from patient and his average gross monthly of recent was calculated to be $3,033.42, which is within [place] income limit of $3,190 for a household of one.     2. Email communication was sent to patient informing him of his eligibility for FA income-wise. Patient was instructed to fill out the [place] application, recently emailed to him, to the best of his ability. He was advised to leave any parts of the application that he did not understand blank and that I would follow-up with him once the application was received by me.    Patient was told that, since we had started the process of applying for FA while his primary case manager, Nicholes Calamity, was away, I would be happy to see it to its completion and have him follow-up with his primary for any future needs    DOCUMENT COMPOSED BY:   Newell Coral, LICSW [phone] on 09/09/2018

## 2018-09-11 ENCOUNTER — Encounter (HOSPITAL_BASED_OUTPATIENT_CLINIC_OR_DEPARTMENT_OTHER): Payer: Self-pay | Admitting: Social Work

## 2018-09-11 NOTE — Progress Notes (Signed)
DOCUMENT TYPE: Social Work - Follow-up Note    PATIENT: Dennis [name], Dennis [name]   MRN: [id number]  DOB: Dec 15, 1990    ENCOUNTER DATE:  09/11/2018    ASSOCIATED PROGRAM-CLINIC:  Madison    CONTACT TYPE:  Email    BRIEF DESCRIPTION:  F/U RE: [place] Medicine FA    PATIENT DESCRIPTION, PRESENTING PROBLEM, PATIENT / FAMILY GOALS:  This Education officer, museum, who started the process of assisting this [facility] patient with applying for [place] ([place]), has yet to receive application from patient.    Patient's primary HIV/AIDS case manager is [employer].    INTERVENTION / OUTCOME / PLAN:  1. This Education officer, museum sent patient an email reminding him that I am waiting to receive FA application from him. A copy of a blank application was attached to email should patient need it.    2. Ms. Roetta Sessions will be updated.    DOCUMENT COMPOSED BY:   Newell Coral, [place] [phone] on 09/11/2018

## 2018-09-28 ENCOUNTER — Encounter (HOSPITAL_BASED_OUTPATIENT_CLINIC_OR_DEPARTMENT_OTHER): Payer: Self-pay | Admitting: Social Work

## 2018-09-28 NOTE — Progress Notes (Signed)
DOCUMENT TYPE: Social Work - Follow-up Note    PATIENT: Dennis, [name]   MRN: [id number]  DOB: [birth year]    ENCOUNTER DATE:  09/28/2018    ASSOCIATED PROGRAM-CLINIC:  Madison    CONTACT TYPE:  Email    BRIEF DESCRIPTION:  F/U RE: [place] Medicine FA    PATIENT DESCRIPTION, PRESENTING PROBLEM, PATIENT / FAMILY GOALS:  This Education officer, museum, who started the process of assisting this [facility] patient with applying for [place] ([place]), has yet to receive application from patient. (See my notes dated 09/01/2018 and 09/03/2018 - 09/11/2018.    Patient's primary HIV/AIDS case manager is Nicholes Calamity is expected to be away from clinic this week.    INTERVENTION / OUTCOME / PLAN:  1. Follow-up email was received from patient. He apologized for not having followed-up sooner, stating he just discovered that his email had been pending in his outbox. Attached to patient's email was his [place] application.     2. This covering case manager thanked patient for follow-up and told him that I would take a look at his application tomorrow and let him know if I need anything further.    DOCUMENT COMPOSED BY:   Newell Coral, [place] [phone] on 09/28/2018

## 2018-09-29 ENCOUNTER — Encounter (HOSPITAL_BASED_OUTPATIENT_CLINIC_OR_DEPARTMENT_OTHER): Payer: Self-pay | Admitting: Social Work

## 2018-09-29 NOTE — Progress Notes (Signed)
DOCUMENT TYPE: Social Work - Follow-up Note    PATIENT: Dennis [name], Dennis [name]   MRN: [id number]  DOB: 1990-06-02    ENCOUNTER DATE:  09/29/2018    ASSOCIATED PROGRAM-CLINIC:  Madison    CONTACT TYPE:  Email    BRIEF DESCRIPTION:  F/U RE: [place] [facility] FA - Application Being Sent to [place], PRESENTING PROBLEM, PATIENT / FAMILY GOALS:  This Education officer, museum, who had started the process of assisting this [facility] patient with applying for [place] ([place]) back in August, received patient's application by email yesterday (see my note dated 09/28/2018).    Patient's primary HIV/AIDS case manager, Nicholes Calamity, is expected to be away from clinic this week.    INTERVENTION / OUTCOME / PLAN:  1. After reviewing patient's application and reconfirming his income--determined by earnings reported during recent three months--this social worker placed completed application in the office of our clinic's financial advocates to be forwarded to [employer].    2. Email communication was sent to patient apprising him of the above. He was told that, if approved, FA will supplement his primary insurance and Early Intervention Program (EIP) enrollment, such that he should have no out-of-pocket expenses. He was further told that FA, like EIP, needs to be renewed every six months.    He was also provided with a heads-up about approaching open enrollment for qualified health plans [facility]). He was told that, if he is eligible for employer-sponsored insurance, he will need to look into enrolling in that and, if he is not, he should work with your Tourist information centre manager on enrolling in a QHP like he did last year. He was advised to see his case manager with any questions.    3. Ms. Roetta Sessions will he updated upon her return to clinic.    DOCUMENT COMPOSED BY:   Newell Coral, [place] [phone] on 09/29/2018

## 2018-10-07 ENCOUNTER — Encounter (HOSPITAL_BASED_OUTPATIENT_CLINIC_OR_DEPARTMENT_OTHER): Payer: Self-pay | Admitting: Social Work

## 2018-10-08 ENCOUNTER — Encounter (HOSPITAL_BASED_OUTPATIENT_CLINIC_OR_DEPARTMENT_OTHER): Payer: Self-pay | Admitting: Social Work

## 2018-10-08 NOTE — Progress Notes (Signed)
DOCUMENT TYPE: Social Work - Follow-up Note    PATIENT: Dennis [name], Dennis [name]   MRN: [id number]  DOB: 04/08/1990    ENCOUNTER DATE:  10/08/2018    ASSOCIATED PROGRAM-CLINIC:  Madison    CONTACT TYPE:  Collateral    BRIEF DESCRIPTION:  F/U RE: [place] Medicine FA    PATIENT DESCRIPTION, PRESENTING PROBLEM, PATIENT / FAMILY GOALS:  This [facility] patient was assisted with submitting an application for [place] ([place]). (See recent case management notes by this social worker.)    Patient's primary HIV/AIDS case manager, Nicholes Calamity, is currently away from clinic with unknown return date.    INTERVENTION / OUTCOME / PLAN:  1. Follow-up correspondence was received from Financial Counseling dated 10/06/2018. Patient is asked to provide information about his primary insurance--a qualified health plan obtained through the [facility].    2. This covering case manager confirmed that information about patient's primary insurance was absent from patient's medical record.    3. With the assistance of [employer], information about patient's Premera Preferred Gold EPO 1500 QHP was obtained. Patient's member ID [id number] will be corrected in patient's electronic case management record, as it appears not to have been entered correctly.    4. This covering case manager requested assistance from front desk staff, Ramond Craver, with adding insurance information to patient's medical record.    5. On reviewing patient's medical record and noting that patient had received care, including lab work, in the months following the effective date of his insurance (04/15/2018), this covering case manager notified financial advocate, Rip Harbour, so those handling billing at [facility] [place] might be duly notified to ensure proper billing. At the same time, [place] Work Supervisors were alerted to the situation, again to ensure proper billing.    6. This covering case manager plans to follow-up with  Financial Counseling tomorrow.    DOCUMENT COMPOSED BY:   Newell Coral, [place] [phone] on 10/08/2018

## 2018-10-08 NOTE — Progress Notes (Signed)
DOCUMENT TYPE: Social Work - Follow-up Note    PATIENT: Dennis [name], Dennis [name]   MRN: [id number]  DOB: 07/15/1990    ENCOUNTER DATE:  10/08/2018    ASSOCIATED PROGRAM-CLINIC:  Madison    CONTACT TYPE:  Collateral    BRIEF DESCRIPTION:  F/U RE: [place] Medicine FA    PATIENT DESCRIPTION, PRESENTING PROBLEM, PATIENT / FAMILY GOALS:  This [facility] patient was assisted with submitting an application for [place] ([place]). Yesterday, correspondence, dated 10/06/2018, was received from Financial Counseling . Patient is asked to provide information about his primary insurance--a qualified health plan obtained through the [facility]. Also, on confirming that patient's medical record did not include his health insurance, this social worker took steps yesterday to address this and ensure proper billing (see my case management note dated 10/07/2018).    Patient's primary HIV/AIDS case manager, Nicholes Calamity, has been away from clinic in the recent.    INTERVENTION / OUTCOME / PLAN:  1. This covering case Social worker with patient's insurance information.    2. Communication was received from [employer], Patient Copy with Patient Financial Services, and Samuel Germany, Transplant Specialty Services, Special Process Billing Coordinator &, Project Specialist & Insurance Follow-up. They confirmed receipt of information necessary for billing that was forwarded to them by [place] advocate, Rip Harbour.    DOCUMENT COMPOSED BY:   Newell Coral, [place] [phone] on 10/08/2018

## 2018-10-09 ENCOUNTER — Encounter (HOSPITAL_BASED_OUTPATIENT_CLINIC_OR_DEPARTMENT_OTHER): Payer: Self-pay | Admitting: Social Work

## 2018-10-13 NOTE — Progress Notes (Signed)
DOCUMENT TYPE: Social Work - Follow-up Note    PATIENT: Howell, Groesbeck   MRN: [id number]  DOB: 1990/07/08    ENCOUNTER DATE:  10/09/2018    ASSOCIATED PROGRAM-CLINIC:  Madison    CONTACT TYPE:  Email    BRIEF DESCRIPTION:  F/U RE: [place] Medicine FA - Approved 10/08/18 - 04/07/19    PATIENT DESCRIPTION, PRESENTING PROBLEM, PATIENT / FAMILY GOALS:  This [facility] patient was assisted with submitting an application to Occupational hygienist for [place] ([place]). Correspondence, dated 10/06/2018, was recently received from [place], in which patient was asked to provide information about his primary insurance--a qualified health plan obtained through the [facility]). This [employer] social worker provided Financial Counseling with requested information.    Per Guarantor Account Note by Lia Foyer, dated 10/08/2018, patient has been "approved for Financial Assistance effective 03/30/18 - 08/31/18 and 10/08/18 - 04/07/19 after insurance pay."    Patient's primary HIV/AIDS case manager in our [facility] is [place].    INTERVENTION / OUTCOME / PLAN:  1. This Education officer, museum sent an email to patient informing him of the outcome of FA application. In my message, patient was reminded that [place] is for the purpose of covering costs not covered by his insurance and Early Intervention Program (EIP). Also, he was told that, in order to maintain FA beyond 04/07/2019, he will need to reapply at that time.    2. This Education officer, museum confirmed that [place] information has been entered into patient's electronic case management record.    3. Ms. Roetta Sessions will be updated.    DOCUMENT COMPOSED BY:   Newell Coral, [place] [phone] on 10/13/2018

## 2018-11-02 ENCOUNTER — Other Ambulatory Visit: Payer: Self-pay

## 2018-12-16 ENCOUNTER — Telehealth (HOSPITAL_BASED_OUTPATIENT_CLINIC_OR_DEPARTMENT_OTHER): Payer: Self-pay | Admitting: Counselor

## 2018-12-18 ENCOUNTER — Encounter (HOSPITAL_BASED_OUTPATIENT_CLINIC_OR_DEPARTMENT_OTHER): Payer: Self-pay | Admitting: Counselor

## 2018-12-21 NOTE — Telephone Encounter (Signed)
DOCUMENT TYPE: Social Work - Follow-up Note    PATIENT: Dennis [name], Dennis [name]   MRN: [id number]  DOB: 03-Feb-1990    ENCOUNTER DATE:  12/16/2018    ASSOCIATED PROGRAM-CLINIC:  Madison    CONTACT TYPE:  Telephone    BRIEF DESCRIPTION:  Medical care/ Service plan/ ROI/ 2021 QHP/ Adherence    PATIENT DESCRIPTION, PRESENTING PROBLEM, PATIENT / FAMILY GOALS:  28 y/o AA gay male with HIV, on HAART meds CD4 count 412, VL undetectable, on HAART meds established HIV care and case management at [facility] clinic. Pt. diagnosed with HIV on 03/21/2014 previous provider Dr. Scharlene Gloss at [place], [place]. His family does not know about his diagnosis, but some his friends know. Thinks that his ex-male partner gave him HIV. Does not have sex for money and does not do IV drugs. Pt. moved from [place] Jan 2016 and was taking Genvoya. Pt. lives with room-mate and would like to get to affordable housing program. Pt. lives at the address: [address] [address], DOB 1990/08/02 phone# [phone]. Pt. is on [facility] coverage and this medical coverage will terminate on 04/13/2017. Pt. is working at [employer]. Pt. enrolled to [facility] through online [facility] and begins on 04/15/2018 thru 01/14/2019. Service plan expired.    INTERVENTION / OUTCOME / PLAN:  SWR called to communicate with pt. about 2021 QHP enrollment, changes on Premera, new [place] coverage and update household income. Pt. said he is working the same job and income changed. Writer requested to fax or scan to e-mail pay stubs. Writer to update new income. Went through service with pt. and completed. Pt. is taking HAART meds regularly. Pt. lives in stable house and works full time. Pt. has active medical insurance coverage. Updated release of information with verbal consent. Will complete 2021 QHP upon receiving pt's new income.     Plan:  1. Assist pt. to engage with medical care.  2. Assist pt. with medical insurance coverage.  3. F/u with adherence to  HAART meds.    DOCUMENT COMPOSED BY:   Nicholes Calamity, MSW [phone] on 12/21/2018

## 2018-12-21 NOTE — Progress Notes (Signed)
DOCUMENT TYPE: Social Work - Follow-up Note    PATIENT: Dennis, [name]   MRN: [id number]  DOB: [birth year]    ENCOUNTER DATE:  12/18/2018    ASSOCIATED PROGRAM-CLINIC:  Madison    CONTACT TYPE:  Telephone    BRIEF DESCRIPTION:  [facility]/ Scann and upload document/ Premera/ Data form submitted    PATIENT DESCRIPTION, PRESENTING PROBLEM, PATIENT / FAMILY GOALS:  28 y/o AA gay male with HIV, on HAART meds CD4 count 412, VL undetectable, on HAART meds established HIV care and case management at [facility] clinic. Pt. diagnosed with HIV on 03/21/2014 previous provider Dr. Scharlene Gloss at [place], [place]. His family does not know about his diagnosis, but some his friends know. Thinks that his ex-male partner gave him HIV. Does not have sex for money and does not do IV drugs. Pt. moved from [place] Jan 2016 and was taking Genvoya. Pt. lives with room-mate and would like to get to affordable housing program. Pt. lives at the address: [place] [address], DOB [birth year] phone# [phone]. Pt. is on [facility] coverage and this medical coverage will terminate on 04/13/2017. Pt. is working at [employer]. Pt. enrolled to [facility] through online [facility] and begins on 04/15/2018 thru 01/14/2019.    INTERVENTION / OUTCOME / PLAN:  SWR received income verification, 2021 QHP enrollment completed through online [facility] application, scanned and uploaded pay stubs. Pt. would like to keep [place], completed data form and submitted.     Plan:  1. Assist pt. with medical insurance coverage.    DOCUMENT COMPOSED BY:   Nicholes Calamity, MSW [phone] on 12/21/2018

## 2019-01-04 ENCOUNTER — Other Ambulatory Visit (HOSPITAL_BASED_OUTPATIENT_CLINIC_OR_DEPARTMENT_OTHER): Payer: Self-pay | Admitting: Infectious Disease

## 2019-01-11 ENCOUNTER — Other Ambulatory Visit (HOSPITAL_BASED_OUTPATIENT_CLINIC_OR_DEPARTMENT_OTHER): Payer: Self-pay | Admitting: Infectious Disease

## 2019-01-18 ENCOUNTER — Encounter (HOSPITAL_BASED_OUTPATIENT_CLINIC_OR_DEPARTMENT_OTHER): Payer: No Typology Code available for payment source | Admitting: Infectious Disease

## 2019-02-08 ENCOUNTER — Encounter (HOSPITAL_BASED_OUTPATIENT_CLINIC_OR_DEPARTMENT_OTHER): Payer: Self-pay | Admitting: Infectious Disease

## 2019-02-08 ENCOUNTER — Ambulatory Visit (HOSPITAL_BASED_OUTPATIENT_CLINIC_OR_DEPARTMENT_OTHER): Payer: No Typology Code available for payment source | Attending: Infectious Disease | Admitting: Infectious Disease

## 2019-02-08 ENCOUNTER — Encounter (HOSPITAL_BASED_OUTPATIENT_CLINIC_OR_DEPARTMENT_OTHER): Payer: Self-pay | Admitting: Counselor

## 2019-02-08 VITALS — BP 112/68 | HR 68 | Temp 98.2°F | Ht 72.76 in | Wt 195.4 lb

## 2019-02-08 DIAGNOSIS — Z21 Asymptomatic human immunodeficiency virus [HIV] infection status: Secondary | ICD-10-CM | POA: Insufficient documentation

## 2019-02-08 DIAGNOSIS — B20 Human immunodeficiency virus [HIV] disease: Secondary | ICD-10-CM | POA: Insufficient documentation

## 2019-02-08 DIAGNOSIS — D709 Neutropenia, unspecified: Secondary | ICD-10-CM | POA: Insufficient documentation

## 2019-02-08 LAB — COMPREHENSIVE METABOLIC PANEL
ALT (GPT): 23 U/L (ref 10–64)
AST (GOT): 18 U/L (ref 9–38)
Albumin: 4.4 g/dL (ref 3.5–5.2)
Alkaline Phosphatase (Total): 93 U/L (ref 35–109)
Anion Gap: 7 (ref 4–12)
Bilirubin (Total): 0.4 mg/dL (ref 0.2–1.3)
Calcium: 9.1 mg/dL (ref 8.9–10.2)
Carbon Dioxide, Total: 29 meq/L (ref 22–32)
Chloride: 101 meq/L (ref 98–108)
Creatinine: 0.99 mg/dL (ref 0.51–1.18)
Glucose: 79 mg/dL (ref 62–125)
Potassium: 3.4 meq/L — ABNORMAL LOW (ref 3.6–5.2)
Protein (Total): 7.4 g/dL (ref 6.0–8.2)
Sodium: 137 meq/L (ref 135–145)
Urea Nitrogen: 7 mg/dL — ABNORMAL LOW (ref 8–21)
eGFR by CKD-EPI: >60 mL/min/{1.73_m2} (ref >59)

## 2019-02-08 LAB — URINALYSIS WORKUP
Bilirubin (Qual), URN: NEGATIVE
Glucose Qual, URN: NEGATIVE mg/dL
Ketones, URN: NEGATIVE mg/dL
Leukocyte Esterase, URN: NEGATIVE
Nitrite, URN: NEGATIVE
Occult Blood, URN: NEGATIVE
Specific Gravity, URN: 1.027 g/mL (ref 1.006–1.027)
pH, URN: 6 (ref 5.0–8.0)

## 2019-02-08 LAB — URINALYSIS MICROSCOPIC
Bacteria, URN: NONE SEEN
Epith Cells_Renal/Trans,URN: NEGATIVE /HPF
Epith Cells_Squamous, URN: NEGATIVE /LPF
RBC, URN: NEGATIVE /HPF
WBC, URN: NEGATIVE /HPF

## 2019-02-08 LAB — CBC, DIFF
% Basophils: 0 %
% Eosinophils: 3 %
% Immature Granulocytes: 0 %
% Lymphocytes: 54 %
% Monocytes: 12 %
% Neutrophils: 31 %
% Nucleated RBC: 0 %
Absolute Eosinophil Count: 0.09 10*3/uL (ref 0.00–0.50)
Absolute Lymphocyte Count: 1.9 10*3/uL (ref 1.00–4.80)
Basophils: 0.01 10*3/uL (ref 0.00–0.20)
Hematocrit: 39 % (ref 38–50)
Hemoglobin: 13.8 g/dL (ref 13.0–18.0)
Immature Granulocytes: 0.01 10*3/uL (ref 0.00–0.05)
MCH: 29.9 pg (ref 27.3–33.6)
MCHC: 35 g/dL (ref 32.2–36.5)
MCV: 85 fL (ref 81–98)
Monocytes: 0.43 10*3/uL (ref 0.00–0.80)
Neutrophils: 1.09 10*3/uL — ABNORMAL LOW (ref 1.80–7.00)
Nucleated RBC: 0 10*3/uL
Platelet Count: 217 10*3/uL (ref 150–400)
RBC: 4.62 10*6/uL (ref 4.40–5.60)
RDW-CV: 13.3 % (ref 11.6–14.4)
WBC: 3.53 10*3/uL — ABNORMAL LOW (ref 4.30–10.00)

## 2019-02-08 MED ORDER — DOLUTEGRAVIR SODIUM 50 MG OR TABS
50.0000 mg | ORAL_TABLET | Freq: Every day | ORAL | 5 refills | Status: DC
Start: 2019-02-08 — End: 2019-08-23

## 2019-02-08 MED ORDER — DESCOVY 200-25 MG OR TABS
1.0000 | ORAL_TABLET | Freq: Every day | ORAL | 5 refills | Status: DC
Start: 2019-02-08 — End: 2019-08-23

## 2019-02-08 NOTE — Progress Notes (Signed)
[facility]  FOLLOW-UP VISIT  02/08/2019    ID/CC: Dennis [name] is a 29 year old man with HIV here for a routine visit.    ASSESSMENT/PLAN:   1. HIV disease ([place]): History as below, CD4 up to 612 in 08/2018 with UD VL since early 2018.Will get other safety labs and VL check today. Refill ART, encouraged continued adherence. Will discuss recommendations for transitioning care to [facility] at next visit.  - emtricitabine-tenofovir alafenamide (Descovy) 200-25 MG tablet; Take 1 tablet by mouth daily.  Dispense: 30 tablet; Refill: 5  - dolutegravir 50 MG tablet; Take 1 tablet (50 mg) by mouth daily.  Dispense: 30 tablet; Refill: 5  - HIV1 RNA Quantitation  - RPR Quantitative  - Comprehensive Metabolic Panel  - Urinalysis Workup    2. Neutropenia, benign ethnic: Stable ANC > 500, asymptomatic. Continue to monitor q6-12 months.  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 He's strongly considering moving to [place], [place] with his partner at the start of next year. Interested in recommendations for a care provider for himself and partner. Feels physically well overall -- no more recurrent abd pain, n/v/d. Reviewed this is most likely 2/2 stress/functional but may have had a resolved gastritis/ulcer or cholelithiasis.    Was a bit surprised recently when his Descovy was supplied to him from [facility] in a bubble pack. He inquired with the pharmacist and they confirmed it was the correct medication but did not have an answer as to why it wasn't in a bottle. Next fill was back to nl. Taking ART as directed, no missed doses or side effects. States he did receive the COVID-19 vaccine after weighing the immediate potential benefits vs longer term risks. Still declines other vaccines for now. Declines STI testing today but willing to  get other labs.     REVIEW OF SYSTEMS:   Please see above.     PHYSICAL EXAMINATION:   Vital Signs: Blood pressure 112/68, pulse 68, temperature 98.2 F (36.8 C), temperature source Temporal, height 6' 0.76" (1.848 m), weight 195 lb 6.4 oz (88.6 kg), SpO2 97 %.    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 Started Genvoya at dx --> DTG + FTC/TAC in 06/2016  HIV-Related Illnesses:None known  Viral Hepatitis:HAV immune 01/2016; HBsAb pos, sAg neg, cAb neg 01/2016; HCV NR 02/2017    I personally reviewed and verified the documented history with the patient and agree with it.    HIV General Healthcare Maintenance  Cardiovascular/metabolic  Lipids: nl in 0/9323  HTN: nl screens at last 4 visits  DM: A1c nl 07/2017  UA: microhematuria in 07/2017. 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 Influenza quadrivalent PF 02/08/2016, 10/10/2016   . Pneumococcal conjugate PCV13 (Prevnar 13) 05/02/2016   . Pneumococcal polysaccharide PPSV23 (Pneumovax 23) 10/10/2016

## 2019-02-11 LAB — HIV1 RNA QUANTITATION
HIV RNA Copies/mL (Log10): <1.6 {Log_copies}/mL
HIV RNA Result: <40 {copies}/mL — AB

## 2019-02-12 NOTE — Progress Notes (Signed)
DOCUMENT TYPE: Social Work - Follow-up Note    PATIENT: Dennis [name], Dennis [name]   MRN: [id number]  DOB: [birth year]    ENCOUNTER DATE:  02/08/2019    ASSOCIATED PROGRAM-CLINIC:  [facility]    CONTACT TYPE:  Face to Face - Office    BRIEF DESCRIPTION:  Medical care/ coverage/ Adherence    PATIENT DESCRIPTION, PRESENTING PROBLEM, PATIENT / FAMILY GOALS:  29 y/o African American gay male with HIV, on HAART meds CD4 count 412, VL undetectable, on HAART meds established HIV care and case management at [facility] clinic. Pt. diagnosed with HIV on 03/21/2014 previous provider Dr. Scharlene Gloss at [place], [place]. His family does not know about his diagnosis, but some his friends know. Thinks that his ex-male partner gave him HIV. Does not have sex for money and does not do IV drugs. Pt. moved from [place] Jan 2016 and was taking Genvoya. Pt. lives with room-mate and would like to get to affordable housing program. Pt. lives at the address: [place] [address], DOB Feb 08, 1990 phone# [phone]. Pt. is on [facility] coverage and this medical coverage will terminate on 04/13/2017. Pt. is working at [employer]. Pt. enrolled to [employer] for 01/15/2019 thru 01/14/2020. In addition pt. is on EIP, EHIP and charity coverage.    INTERVENTION / OUTCOME / PLAN:  SWR met pt. to discuss re: medical care, insurance coverage and household income. Pt. reported no changes in household income. He continues with [employer] Pref Gold coverage. EIP and charity expires in March 2021. Pt. is taking HAART meds regularly.     Plan:  1. Assist pt. to engage with medical care.  2. F/u with adherence to meds.    DOCUMENT COMPOSED BY:   Nicholes Calamity, MSW [phone] on 02/12/2019

## 2019-02-16 LAB — REFERENCE LABORATORY TEST 1: Reference Lab: Result 1 (Sendout): REACTIVE

## 2019-02-16 LAB — RPR QUANTITATIVE (SENDOUT): RPR Quantitative (Sendout): 1:1 {titer} — AB

## 2019-02-16 NOTE — Progress Notes (Signed)
Stable serofast RPR, unchanged.

## 2019-03-04 ENCOUNTER — Telehealth (HOSPITAL_BASED_OUTPATIENT_CLINIC_OR_DEPARTMENT_OTHER): Payer: Self-pay | Admitting: Counselor

## 2019-03-10 NOTE — Telephone Encounter (Signed)
DOCUMENT TYPE: Social Work - Follow-up Note    PATIENT: Dennis, [name]   MRN: [id number]  DOB: Sep 13, 1990    ENCOUNTER DATE:  03/04/2019    ASSOCIATED PROGRAM-CLINIC:  [facility]    CONTACT TYPE:  Telephone    BRIEF DESCRIPTION:  RE: EIP and Charity care    PATIENT DESCRIPTION, PRESENTING PROBLEM, PATIENT / FAMILY GOALS:  30 y/o African American gay male with HIV, on HAART meds CD4 count 412, VL undetectable, on HAART meds established HIV care and case management at [facility] clinic. Pt. diagnosed with HIV on 03/21/2014 previous provider Dr. Staci Righter at [place], [place]. His family does not know about his diagnosis, but some his friends know. Thinks that his ex-male partner gave him HIV. Does not have sex for money and does not do IV drugs. Pt. moved from [place] Jan 2016 and was taking Genvoya. Pt. lives with room-mate and would like to get to affordable housing program. Pt. lives at the address: [address] [place], [place] [address], DOB [birth year] phone# [phone]. Pt. is on [facility] coverage and this medical coverage will terminate on 04/13/2017. Pt. is working at [employer]. Pt. enrolled to [facility] Pref Gold for 01/15/2019 thru 01/14/2020. In addition pt. is on EIP, EHIP and charity coverage.    INTERVENTION / OUTCOME / PLAN:  SWR communicated to pt. that EIP and charity coverage expired in March. Pt. said he will e-mail paystubs -3 mo. Writer will assist complete upon receiving required documents.     Plan:  1. Assist pt. with EIP and charity care renewal.    DOCUMENT COMPOSED BY:   [name], MSW [phone] on 03/10/2019

## 2019-03-31 ENCOUNTER — Encounter (HOSPITAL_BASED_OUTPATIENT_CLINIC_OR_DEPARTMENT_OTHER): Payer: Self-pay | Admitting: Counselor

## 2019-04-01 NOTE — Progress Notes (Signed)
DOCUMENT TYPE: Social Work - Follow-up Note    PATIENT: Dennis [name], Dennis [name]   MRN: [id number]  DOB: Aug 08, 1990    ENCOUNTER DATE:  03/31/2019    ASSOCIATED PROGRAM-CLINIC:  [facility]    CONTACT TYPE:  Collateral    BRIEF DESCRIPTION:  EIP renewal    PATIENT DESCRIPTION, PRESENTING PROBLEM, PATIENT / FAMILY GOALS:  29y/o African American gay male with HIV, on HAART meds CD4 count 412, VL undetectable, on HAART meds established HIV care and case management at [facility] clinic. Pt. diagnosed with HIV on 03/21/2014 previous provider Dr. Staci Righter at [place], [place]. His family does not know about his diagnosis, but some his friends know. Thinks that his ex-male partner gave him HIV. Does not have sex for money and does not do IV drugs. Pt. moved from [place] Jan 2016 and was taking Genvoya. Pt. lives with room-mate and would like to get to affordable housing program. Pt. lives at the address: [address] [place], [place] [address], DOB 1990/04/28 phone# [phone]. Pt. is on [facility] coverage and this medical coverage will terminate on 04/13/2017. Pt. is working at [employer]. Pt. enrolled to [facility] Pref Gold for 01/15/2019 thru 01/14/2020. In addition pt. is on EIP, EHIP and charity coverage. EIP expires on 3/31.    INTERVENTION / OUTCOME / PLAN:  SWR received 3 paystubs for EIP renewal, submitted to [facility].     Plan:  1. Assist pt. with EIP renewal    DOCUMENT COMPOSED BY:   [name], MSW [phone] on 04/01/2019

## 2019-06-23 ENCOUNTER — Other Ambulatory Visit (HOSPITAL_BASED_OUTPATIENT_CLINIC_OR_DEPARTMENT_OTHER): Payer: Self-pay | Admitting: Infectious Disease

## 2019-08-11 ENCOUNTER — Encounter (HOSPITAL_BASED_OUTPATIENT_CLINIC_OR_DEPARTMENT_OTHER): Payer: Self-pay | Admitting: Infectious Disease

## 2019-08-23 ENCOUNTER — Ambulatory Visit: Payer: No Typology Code available for payment source | Attending: Infectious Disease | Admitting: Infectious Disease

## 2019-08-23 ENCOUNTER — Other Ambulatory Visit (HOSPITAL_BASED_OUTPATIENT_CLINIC_OR_DEPARTMENT_OTHER): Payer: Self-pay | Admitting: Infectious Disease

## 2019-08-23 VITALS — BP 110/68 | HR 62 | Temp 97.5°F | Resp 16 | Ht 72.84 in | Wt 189.0 lb

## 2019-08-23 DIAGNOSIS — R809 Proteinuria, unspecified: Secondary | ICD-10-CM

## 2019-08-23 DIAGNOSIS — R1030 Lower abdominal pain, unspecified: Secondary | ICD-10-CM | POA: Insufficient documentation

## 2019-08-23 DIAGNOSIS — Z21 Asymptomatic human immunodeficiency virus [HIV] infection status: Secondary | ICD-10-CM

## 2019-08-23 DIAGNOSIS — D709 Neutropenia, unspecified: Secondary | ICD-10-CM

## 2019-08-23 DIAGNOSIS — B20 Human immunodeficiency virus [HIV] disease: Secondary | ICD-10-CM | POA: Insufficient documentation

## 2019-08-23 LAB — URINALYSIS COMPLETE, URN
Bacteria, URN: NONE SEEN
Bilirubin (Qual), URN: NEGATIVE
Epith Cells_Renal/Trans,URN: NEGATIVE /HPF
Epith Cells_Squamous, URN: NEGATIVE /LPF
Glucose Qual, URN: NEGATIVE mg/dL
Ketones, URN: NEGATIVE mg/dL
Leukocyte Esterase, URN: NEGATIVE
Nitrite, URN: NEGATIVE
Occult Blood, URN: NEGATIVE
Protein (Alb Semiquant), URN: NEGATIVE mg/dL
RBC, URN: NEGATIVE /HPF
Specific Gravity, URN: 1.01 g/mL (ref 1.006–1.027)
WBC, URN: NEGATIVE /HPF
pH, URN: 6.5 (ref 5.0–8.0)

## 2019-08-23 LAB — CBC, DIFF
% Basophils: 0 %
% Eosinophils: 2 %
% Immature Granulocytes: 0 %
% Lymphocytes: 63 %
% Monocytes: 11 %
% Neutrophils: 24 %
% Nucleated RBC: 0 %
Absolute Eosinophil Count: 0.06 10*3/uL (ref 0.00–0.50)
Absolute Lymphocyte Count: 1.86 10*3/uL (ref 1.00–4.80)
Basophils: 0.01 10*3/uL (ref 0.00–0.20)
Hematocrit: 40 % (ref 38–50)
Hemoglobin: 14 g/dL (ref 13.0–18.0)
Immature Granulocytes: 0.01 10*3/uL (ref 0.00–0.05)
MCH: 29.4 pg (ref 27.3–33.6)
MCHC: 35.2 g/dL (ref 32.2–36.5)
MCV: 83 fL (ref 81–98)
Monocytes: 0.33 10*3/uL (ref 0.00–0.80)
Neutrophils: 0.7 10*3/uL — ABNORMAL LOW (ref 1.80–7.00)
Nucleated RBC: 0 10*3/uL
Platelet Count: 212 10*3/uL (ref 150–400)
RBC: 4.77 10*6/uL (ref 4.40–5.60)
RDW-CV: 12.9 % (ref 11.6–14.4)
WBC: 2.97 10*3/uL — ABNORMAL LOW (ref 4.30–10.00)

## 2019-08-23 LAB — BASIC METABOLIC PANEL
Anion Gap: 6 (ref 4–12)
Calcium: 9.7 mg/dL (ref 8.9–10.2)
Carbon Dioxide, Total: 28 meq/L (ref 22–32)
Chloride: 101 meq/L (ref 98–108)
Creatinine: 1.15 mg/dL (ref 0.51–1.18)
Glucose: 91 mg/dL (ref 62–125)
Potassium: 4.4 meq/L (ref 3.6–5.2)
Sodium: 135 meq/L (ref 135–145)
Urea Nitrogen: 10 mg/dL (ref 8–21)
eGFR by CKD-EPI: >60 mL/min/{1.73_m2} (ref >59)

## 2019-08-23 MED ORDER — DOLUTEGRAVIR SODIUM 50 MG OR TABS
50.0000 mg | ORAL_TABLET | Freq: Every day | ORAL | 5 refills | Status: DC
Start: 2019-08-23 — End: 2019-12-17

## 2019-08-23 MED ORDER — DESCOVY 200-25 MG OR TABS
1.0000 | ORAL_TABLET | Freq: Every day | ORAL | 5 refills | Status: DC
Start: 2019-08-23 — End: 2019-12-17
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
## 2019-08-24 ENCOUNTER — Encounter (HOSPITAL_BASED_OUTPATIENT_CLINIC_OR_DEPARTMENT_OTHER): Payer: Self-pay | Admitting: Counselor

## 2019-08-24 LAB — GC&CHLAM NUCLEIC ACID DETECTN
Chlam Trachomatis Nucleic Acid: NEGATIVE
Chlam Trachomatis Nucleic Acid: NEGATIVE
Chlam Trachomatis Nucleic Acid: NEGATIVE
N.Gonorrhoeae(GC) Nucleic Acid: NEGATIVE
N.Gonorrhoeae(GC) Nucleic Acid: NEGATIVE
N.Gonorrhoeae(GC) Nucleic Acid: NEGATIVE

## 2019-08-26 ENCOUNTER — Telehealth (HOSPITAL_BASED_OUTPATIENT_CLINIC_OR_DEPARTMENT_OTHER): Payer: Self-pay | Admitting: Counselor

## 2019-08-26 LAB — HIV1 RNA QUANTITATION: HIV RNA Result: NOT DETECTED {copies}/mL
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
## 2019-09-01 ENCOUNTER — Other Ambulatory Visit: Payer: Self-pay

## 2019-09-01 LAB — RPR QUANTITATIVE (SENDOUT): RPR Quantitative (Sendout): 1:1 {titer} — AB

## 2019-09-01 LAB — REFERENCE LABORATORY TEST 1: Reference Lab: Result 1 (Sendout): REACTIVE

## 2019-09-02 ENCOUNTER — Other Ambulatory Visit (HOSPITAL_BASED_OUTPATIENT_CLINIC_OR_DEPARTMENT_OTHER): Payer: Self-pay | Admitting: Infectious Disease

## 2019-09-02 ENCOUNTER — Ambulatory Visit
Admission: RE | Admit: 2019-09-02 | Discharge: 2019-09-02 | Disposition: A | Discharge status: Home / Self Care | Payer: No Typology Code available for payment source | Attending: Diagnostic Radiology | Admitting: Diagnostic Radiology

## 2019-09-02 DIAGNOSIS — R1030 Lower abdominal pain, unspecified: Secondary | ICD-10-CM

## 2019-09-03 ENCOUNTER — Encounter (HOSPITAL_BASED_OUTPATIENT_CLINIC_OR_DEPARTMENT_OTHER): Payer: Self-pay | Admitting: Infectious Disease

## 2019-10-21 ENCOUNTER — Telehealth (HOSPITAL_BASED_OUTPATIENT_CLINIC_OR_DEPARTMENT_OTHER): Payer: Self-pay | Admitting: Counselor

## 2019-10-25 ENCOUNTER — Telehealth (HOSPITAL_BASED_OUTPATIENT_CLINIC_OR_DEPARTMENT_OTHER): Payer: Self-pay | Admitting: Counselor
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
## 2019-11-14 ENCOUNTER — Ambulatory Visit
Admission: RE | Admit: 2019-11-14 | Discharge: 2019-11-14 | Disposition: A | Discharge status: Home / Self Care | Payer: Medicaid Other | Source: Ambulatory Visit | Attending: Oral and Maxillofacial Surgery | Admitting: Oral and Maxillofacial Surgery

## 2019-11-14 DIAGNOSIS — B20 Human immunodeficiency virus [HIV] disease: Secondary | ICD-10-CM | POA: Insufficient documentation

## 2019-11-18 ENCOUNTER — Other Ambulatory Visit: Payer: Self-pay

## 2019-11-29 ENCOUNTER — Encounter (HOSPITAL_BASED_OUTPATIENT_CLINIC_OR_DEPARTMENT_OTHER): Payer: Self-pay | Admitting: Counselor
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
## 2019-12-14 ENCOUNTER — Ambulatory Visit
Admission: RE | Admit: 2019-12-14 | Discharge: 2019-12-14 | Disposition: A | Discharge status: Home / Self Care | Payer: Medicaid Other | Source: Ambulatory Visit | Attending: Oral and Maxillofacial Surgery | Admitting: Oral and Maxillofacial Surgery

## 2019-12-14 DIAGNOSIS — B20 Human immunodeficiency virus [HIV] disease: Secondary | ICD-10-CM | POA: Insufficient documentation

## 2019-12-17 ENCOUNTER — Other Ambulatory Visit (HOSPITAL_BASED_OUTPATIENT_CLINIC_OR_DEPARTMENT_OTHER): Payer: Self-pay | Admitting: Infectious Disease

## 2019-12-17 DIAGNOSIS — Z21 Asymptomatic human immunodeficiency virus [HIV] infection status: Secondary | ICD-10-CM

## 2019-12-20 MED ORDER — DESCOVY 200-25 MG OR TABS
1.0000 | ORAL_TABLET | Freq: Every day | ORAL | 1 refills | Status: DC
Start: 2019-12-20 — End: 2020-02-07

## 2019-12-20 MED ORDER — DOLUTEGRAVIR SODIUM 50 MG OR TABS
50.0000 mg | ORAL_TABLET | Freq: Every day | ORAL | 1 refills | Status: DC
Start: 2019-12-20 — End: 2020-02-07

## 2019-12-24 ENCOUNTER — Telehealth (HOSPITAL_BASED_OUTPATIENT_CLINIC_OR_DEPARTMENT_OTHER): Payer: Self-pay | Admitting: Counselor

## 2019-12-27 ENCOUNTER — Encounter (HOSPITAL_BASED_OUTPATIENT_CLINIC_OR_DEPARTMENT_OTHER): Payer: Self-pay | Admitting: Counselor

## 2019-12-27 NOTE — Telephone Encounter (Signed)
DOCUMENT TYPE: Social Work - Follow-up Note    PATIENT: Dennis [name], Dennis [name]   MRN: [id number]  DOB: [birth year]    ENCOUNTER DATE:  12/24/2019    ASSOCIATED PROGRAM-CLINIC:  [facility]    CONTACT TYPE:  Telephone    BRIEF DESCRIPTION:  RE: Employment/ WAH/ 2022 QHP enrollment/ EIP/ EHIP/ ROI    PATIENT DESCRIPTION, PRESENTING PROBLEM, PATIENT / FAMILY GOALS:  29 y/o African American gay male with HIV, on HAART meds CD4 count 412, VL undetectable, on HAART meds established HIV care and case management at [facility] clinic. Pt. diagnosed with HIV on 03/21/2014 previous provider Dr. Staci Righter at [place], [place]. His family does not know about his diagnosis, but some his friends know. Pt. moved from [place] Jan 2016 and was taking Genvoya. Pt. lives at the address: [address] [place], [place] [address], DOB Dec 05, 1990,  phone# [phone]. Pt. is on [facility] coverage. Pt. is back to work and income changed. SWR called to check about employment, income, [facility] and QHP coverage.    INTERVENTION / OUTCOME / PLAN:  SWR communicated with pt. about medical care, employment, ESI coverage and QHP coverage. At present time pt. is on [facility]. He reported that he is working, no ESI coverage and his income is above [facility] income guidelines. Discussed with [name] re: 2022 QHP coverage, EIP and EHIP form. Explained to pt. online WHPF household income update. Pt. agreed to e-mail 16mo paystubs and gave verbal consent for ROI, EHIP and EIP.     Plan:  1. Assist pt. with 2022 medical coverage.  2. Assist pt. with premium payment.  3. Update income online WHPF acct.    DOCUMENT COMPOSED BY:   [name], MSW [phone] on 12/27/2019

## 2019-12-28 NOTE — Progress Notes (Signed)
DOCUMENT TYPE: Social Work - Follow-up Note    PATIENT: Dennis [name], Dennis [name]   MRN: [id number]  DOB: June 22, 1990    ENCOUNTER DATE:  12/27/2019    ASSOCIATED PROGRAM-CLINIC:  [facility]    CONTACT TYPE:  Collateral    BRIEF DESCRIPTION:  QHP enrollment/ EHIP/ EIP/ Data form    PATIENT DESCRIPTION, PRESENTING PROBLEM, PATIENT / FAMILY GOALS:  29 y/o African American gay male with HIV, on HAART meds CD4 count 412, VL undetectable, on HAART meds established HIV care and case management at [facility] clinic. Pt. diagnosed with HIV on 03/21/2014 previous provider Dr. Staci Righter at [place], [place]. His family does not know about his diagnosis, but some his friends know. Pt. moved from [place] Jan 2016 and was taking Genvoya. Pt. lives at the address: [address] [place], [place] [address], DOB 1990-01-22,  phone# [phone]. Pt. is on [facility] coverage. Pt. is back to work and income changed. Pt. gave verbal consent to update WHPF acct and enroll to QHP, EIP and EHIP coverage.    INTERVENTION / OUTCOME / PLAN:  SWR received 6 paystubs and he paid weekly. Pt. works at [employer] at the address [address] [place], [place] [address]. SWR assisted pt. to update new income online WHPF acct. At present time pt. is on [facility], after updating new income this coverage will be terminate on 01/14/2020 and pt. approved with QHP. Selected [employer] [place] form completed with new premium payment, EHIP attestation form completed, attached Data form and submitted.     With [name] consent EIP application completed, attached EIP attestation form, attached paystubs and submitted.     Plan:  1. Assist pt. with QHP/ Medical coverage.   2. Assist pt. with EHIP for premium payment.  3. Assist pt. with EIP coverage.    DOCUMENT COMPOSED BY:   [name], MSW [phone] on 12/28/2019

## 2020-01-10 ENCOUNTER — Encounter (HOSPITAL_BASED_OUTPATIENT_CLINIC_OR_DEPARTMENT_OTHER): Payer: Medicaid Other | Admitting: Infectious Disease

## 2020-01-14 ENCOUNTER — Ambulatory Visit
Admission: RE | Admit: 2020-01-14 | Discharge: 2020-01-14 | Disposition: A | Discharge status: Home / Self Care | Payer: Medicaid Other | Source: Ambulatory Visit | Attending: Oral and Maxillofacial Surgery | Admitting: Oral and Maxillofacial Surgery

## 2020-01-14 DIAGNOSIS — B20 Human immunodeficiency virus [HIV] disease: Secondary | ICD-10-CM | POA: Insufficient documentation

## 2020-02-07 ENCOUNTER — Ambulatory Visit: Payer: 59 | Attending: Infectious Disease | Admitting: Infectious Disease

## 2020-02-07 ENCOUNTER — Other Ambulatory Visit (HOSPITAL_BASED_OUTPATIENT_CLINIC_OR_DEPARTMENT_OTHER): Payer: Self-pay | Admitting: Infectious Disease

## 2020-02-07 VITALS — BP 117/73 | HR 80 | Temp 97.9°F | Resp 16 | Ht 72.0 in | Wt 165.0 lb

## 2020-02-07 DIAGNOSIS — B20 Human immunodeficiency virus [HIV] disease: Secondary | ICD-10-CM

## 2020-02-07 DIAGNOSIS — Z21 Asymptomatic human immunodeficiency virus [HIV] infection status: Secondary | ICD-10-CM

## 2020-02-07 DIAGNOSIS — R634 Abnormal weight loss: Secondary | ICD-10-CM | POA: Insufficient documentation

## 2020-02-07 LAB — 1ST EXTRA LIME GREEN TOP

## 2020-02-07 MED ORDER — DOLUTEGRAVIR SODIUM 50 MG OR TABS
50.0000 mg | ORAL_TABLET | Freq: Every day | ORAL | 5 refills | Status: DC
Start: 2020-02-07 — End: 2020-05-08

## 2020-02-07 MED ORDER — DESCOVY 200-25 MG OR TABS
1.0000 | ORAL_TABLET | Freq: Every day | ORAL | 1 refills | Status: DC
Start: 2020-02-07 — End: 2020-04-03
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
## 2020-02-08 LAB — T CELL SUBSETS CD3/CD4/CD8 COUNTS
% CD3: 72 % (ref 57–87)
% CD4: 39 % (ref 33–61)
% CD8: 30 % (ref 14–35)
Abs CD3+ Lymphocyte Cnt: 1.212 10*3/uL (ref 1.040–3.400)
Abs CD4 Lymphocyte Cnt: 0.667 10*3/uL — ABNORMAL LOW (ref 0.730–2.250)
Abs CD8 Lymphocyte Cnt: 0.502 10*3/uL (ref 0.250–1.240)
CD4/CD8 Ratio: 1.33 (ref 1.00–3.78)

## 2020-02-08 LAB — GC&CHLAM NUCLEIC ACID DETECTN
Chlam Trachomatis Nucleic Acid: NEGATIVE
Chlam Trachomatis Nucleic Acid: NEGATIVE
Chlam Trachomatis Nucleic Acid: NEGATIVE
N.Gonorrhoeae(GC) Nucleic Acid: NEGATIVE
N.Gonorrhoeae(GC) Nucleic Acid: NEGATIVE
N.Gonorrhoeae(GC) Nucleic Acid: NEGATIVE

## 2020-02-09 LAB — HIV1 RNA QUANTITATION: HIV RNA Result: NOT DETECTED {copies}/mL

## 2020-02-16 ENCOUNTER — Encounter (HOSPITAL_BASED_OUTPATIENT_CLINIC_OR_DEPARTMENT_OTHER): Payer: Self-pay | Admitting: Infectious Disease

## 2020-02-16 DIAGNOSIS — A539 Syphilis, unspecified: Secondary | ICD-10-CM

## 2020-02-16 LAB — RPR QUANTITATIVE (SENDOUT): RPR Quantitative (Sendout): 1:4 {titer} — AB

## 2020-02-16 LAB — REFERENCE LABORATORY TEST 1: Reference Lab: Result 1 (Sendout): REACTIVE

## 2020-02-17 MED ORDER — PENICILLIN G BENZATHINE 2400000 UNIT/4ML IM SUSY
2.4000 10*6.[IU] | PREFILLED_SYRINGE | Freq: Once | INTRAMUSCULAR | Status: AC
Start: 2020-02-17 — End: 2020-02-18
  Administered 2020-02-18: 2.4 10*6.[IU] via INTRAMUSCULAR

## 2020-02-17 NOTE — Telephone Encounter (Signed)
Called patient and he will come in for treatment tomorrow or Monday.

## 2020-02-18 ENCOUNTER — Ambulatory Visit: Payer: 59 | Attending: Internal Medicine

## 2020-02-18 DIAGNOSIS — A539 Syphilis, unspecified: Secondary | ICD-10-CM | POA: Insufficient documentation

## 2020-02-18 MED ORDER — PENICILLIN G BENZATHINE 1200000 UNIT/2ML IM SUSY
PREFILLED_SYRINGE | INTRAMUSCULAR | Status: AC
Start: 2020-02-18 — End: ?
  Filled 2020-02-18: qty 4

## 2020-02-18 NOTE — Progress Notes (Signed)
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      Reviewed name, DOB, and allergies. Pt states that they have taken penicillin treatment in the past with no issues.      Neuro/Oto/Ocular Syphilis screening questionnaire  Symptoms of Otosyphilis (If positive refer to ENT):  1. Have you recently had new trouble hearing? NO  2. Do you have ringing in your ears? NO    Symptoms of Ocular syphilis (If positive refer to Ophthalmology):  1. Have you recently had a change in vision? NO  2. Do you see flashing lights? NO  3. Do you see spots that move or float by in your vision? NO  4. Have you had any blurring of your vision?NO    Symptoms of Neurosyphilis(If positive consider a lumbar puncture):  1. Are you having headaches? NO  2. Have you recently been confused? NO  3. Has your memory recently gotten worse? NO  4. Do you have trouble concentrating? NO  5. Do you feel that your personality has recently changed? NO  6. Are you having a new problem walking? NO  7. Do you have weakness or numbness in your legs? NO    Pt was encouraged to return in 3 months or sooner if needed for follow-up testing. Pt educated not to have sex for the next 7 days and to notify any sexual partners of exposure. Attending MD notified.     Treatment:  Medication Administrations This Visit       penicillin G benzathine (Bicillin L-A) injection 2.4 Million Units Admin Date  02/18/2020  17:30 Action  Given Dose  2.4 Million Units Route  Intramuscular Site  Other Administered  By  Romie Levee, CMA    Ordering Provider: Despina Hidden, MD    NDC: 86761-950-93    Lot#: [id number]    Comments: Bilateral dorsogluteal 1.45mU each site

## 2020-02-21 ENCOUNTER — Other Ambulatory Visit (HOSPITAL_BASED_OUTPATIENT_CLINIC_OR_DEPARTMENT_OTHER): Payer: Self-pay | Admitting: Infectious Disease

## 2020-03-17 ENCOUNTER — Encounter (HOSPITAL_BASED_OUTPATIENT_CLINIC_OR_DEPARTMENT_OTHER): Payer: Self-pay | Admitting: Clinical Social Worker

## 2020-03-17 NOTE — Progress Notes (Signed)
DOCUMENT TYPE: Social Work - Follow-up Note    PATIENT: Dennis [name], Dennis [name]   MRN: [id number]  DOB: 12/21/1990    ENCOUNTER DATE:  03/17/2020    ASSOCIATED PROGRAM-CLINIC:  [facility]    CONTACT TYPE:  Telephone    BRIEF DESCRIPTION:  LDP    PATIENT DESCRIPTION, PRESENTING PROBLEM, PATIENT / FAMILY GOALS:  30 y/o African American gay male with HIV, on HAART meds CD4 count 412, VL undetectable, on HAART meds established HIV care and case management at [facility] clinic. Pt. diagnosed with HIV on 03/21/2014 previous provider Dr. Staci Righter at [place], [place]. His family does not know about his diagnosis, but some his friends know. Pt. moved from [place] Jan 2016 and was taking Genvoya. Pt. lives at the address: [address] [place], [place] [address], DOB December 31, 1990,  phone# [phone]. Pt. is on [facility] coverage. Pt. is back to work and income changed. Pt. gave verbal consent to update WHPF acct and enroll to QHP, EIP and EHIP coverage.     This SW is covering for pt's case manager who is away from the clinic. This SW contacted pt to fill out LDP re-assessment.    INTERVENTION / OUTCOME / PLAN:  TC contact with pt who stated that he already filled out the LDP re-assessment form and sent it in. Pt stated that he needs an appointment, SW advised him to call [facility], pt stated that he knows the number and will call them.    DOCUMENT COMPOSED BY:   Michel Harrow, MSW on 03/17/2020

## 2020-03-31 ENCOUNTER — Other Ambulatory Visit (HOSPITAL_BASED_OUTPATIENT_CLINIC_OR_DEPARTMENT_OTHER): Payer: Self-pay | Admitting: Infectious Disease

## 2020-03-31 DIAGNOSIS — Z21 Asymptomatic human immunodeficiency virus [HIV] infection status: Secondary | ICD-10-CM

## 2020-04-03 MED ORDER — DESCOVY 200-25 MG OR TABS
ORAL_TABLET | ORAL | 1 refills | Status: DC
Start: 2020-04-03 — End: 2020-05-08

## 2020-04-13 ENCOUNTER — Encounter (HOSPITAL_BASED_OUTPATIENT_CLINIC_OR_DEPARTMENT_OTHER): Payer: Self-pay | Admitting: Infectious Disease

## 2020-05-03 ENCOUNTER — Encounter (HOSPITAL_BASED_OUTPATIENT_CLINIC_OR_DEPARTMENT_OTHER): Payer: Self-pay | Admitting: Counselor

## 2020-05-03 NOTE — Progress Notes (Signed)
DOCUMENT TYPE: Social Work - Follow-up Note    PATIENT: Dennis [name], Dennis [name]   MRN: [id number]  DOB: 03-11-1990    ENCOUNTER DATE:  05/03/2020    ASSOCIATED PROGRAM-CLINIC:  [facility]    CONTACT TYPE:  Attempted    BRIEF DESCRIPTION:  pre-appointment check-in    PATIENT DESCRIPTION, PRESENTING PROBLEM, PATIENT / FAMILY GOALS:  Pt is 30 year old African American gay male with HIV, CD4 count 412, VL undetectable, on HAART meds established HIV care and case management at [facility] clinic. Pt diagnosed with HIV on 03/21/2014 previous provider Dr. Staci Righter at [place], [place]. His family does not know about his diagnosis, but some his friends know. Pt moved from [place] Jan 2016 and was taking Genvoya. Pt lives at the address: [address] [place], [place] [address], DOB Oct 30, 1990, phone# [phone]. Pt is on [employer] plan.    SW called pt for pre-appointment check in.    INTERVENTION / OUTCOME / PLAN:  VM left for pt asking for a call back. SW attempted to check in with pt as she won't be in the day of his appointment and pt's service plan needs update.    Plan  VM left asking for a call back.  SW will remain available as needed for support.    DOCUMENT COMPOSED BY:   Gretta Began, MSW [phone] on 05/03/2020

## 2020-05-04 ENCOUNTER — Encounter (HOSPITAL_BASED_OUTPATIENT_CLINIC_OR_DEPARTMENT_OTHER): Payer: Self-pay | Admitting: Counselor

## 2020-05-04 NOTE — Progress Notes (Signed)
DOCUMENT TYPE: Social Work - Follow-up Note    PATIENT: Dennis [name], Dennis [name]   MRN: [id number]  DOB: 1990/02/03    ENCOUNTER DATE:  05/04/2020    ASSOCIATED PROGRAM-CLINIC:  [facility]    CONTACT TYPE:  Telephone    BRIEF DESCRIPTION:  pre-appointment check-in    PATIENT DESCRIPTION, PRESENTING PROBLEM, PATIENT / FAMILY GOALS:  Pt is 30 year old African American gay male with HIV, CD4 count 412, VL undetectable, on ART meds established HIV care and case management at [facility] clinic. Pt diagnosed with HIV on 03/21/2014 previous provider Dr. Staci Righter at [place], [place]. His family does not know about his diagnosis, but some his friends know. Pt's CM/SW is [name] who is out; this SW is covering.    SW called pt for pre-appointment check-in.    INTERVENTION / OUTCOME / PLAN:  SW introduced self and role. Pt was willing to participate in service plan update. Pt reports compliance with meds and medical appointments. Pt is seeing a dentist currently. Pt expressed increased anxiety due to changes in his life; pt ended a relationship, moved and started working at a new place. Pt is interested in getting a therapist; SW agreed to work with pt in locating one. Pt agrees to discuss his anxiety with his doctor during his appointment. Pt denies substance use. Pt denies other SW needs at this point.    Plan  SW will call pt next week re: finding a therapist.  Service plan updated.  SW will remain available as needed for support.    DOCUMENT COMPOSED BY:   Gretta Began, MSW [phone] on 05/04/2020

## 2020-05-08 ENCOUNTER — Ambulatory Visit: Payer: 59 | Attending: Infectious Disease | Admitting: Infectious Disease

## 2020-05-08 ENCOUNTER — Other Ambulatory Visit (HOSPITAL_BASED_OUTPATIENT_CLINIC_OR_DEPARTMENT_OTHER): Payer: Self-pay | Admitting: Infectious Disease

## 2020-05-08 VITALS — BP 110/69 | HR 64 | Temp 96.6°F | Resp 16 | Wt 177.0 lb

## 2020-05-08 DIAGNOSIS — D709 Neutropenia, unspecified: Secondary | ICD-10-CM | POA: Insufficient documentation

## 2020-05-08 DIAGNOSIS — B20 Human immunodeficiency virus [HIV] disease: Secondary | ICD-10-CM | POA: Insufficient documentation

## 2020-05-08 DIAGNOSIS — Z21 Asymptomatic human immunodeficiency virus [HIV] infection status: Secondary | ICD-10-CM

## 2020-05-08 DIAGNOSIS — F4322 Adjustment disorder with anxiety: Secondary | ICD-10-CM | POA: Insufficient documentation

## 2020-05-08 LAB — CBC, DIFF
% Basophils: 1 %
% Eosinophils: 2 %
% Immature Granulocytes: 0 %
% Lymphocytes: 55 %
% Monocytes: 14 %
% Neutrophils: 28 %
% Nucleated RBC: 0 %
Absolute Eosinophil Count: 0.05 10*3/uL (ref 0.00–0.50)
Absolute Lymphocyte Count: 1.73 10*3/uL (ref 1.00–4.80)
Basophils: 0.02 10*3/uL (ref 0.00–0.20)
Hematocrit: 39 % (ref 38.0–50.0)
Hemoglobin: 13.4 g/dL (ref 13.0–18.0)
Immature Granulocytes: 0 10*3/uL (ref 0.00–0.05)
MCH: 29.5 pg (ref 27.3–33.6)
MCHC: 34.6 g/dL (ref 32.2–36.5)
MCV: 85 fL (ref 81–98)
Monocytes: 0.43 10*3/uL (ref 0.00–0.80)
Neutrophils: 0.86 10*3/uL — ABNORMAL LOW (ref 1.80–7.00)
Nucleated RBC: 0 10*3/uL
Platelet Count: 214 10*3/uL (ref 150–400)
RBC: 4.54 10*6/uL (ref 4.40–5.60)
RDW-CV: 13.4 % (ref 11.6–14.4)
WBC: 3.09 10*3/uL — ABNORMAL LOW (ref 4.3–10.0)

## 2020-05-08 LAB — HEPATITIS C AB WITH REFLEX PCR: Hepatitis C Antibody w/Rflx PCR: NONREACTIVE

## 2020-05-08 MED ORDER — DULOXETINE HCL 30 MG OR CPEP
DELAYED_RELEASE_CAPSULE | ORAL | 0 refills | Status: DC
Start: 2020-05-08 — End: 2020-06-07

## 2020-05-08 MED ORDER — DESCOVY 200-25 MG OR TABS
1.0000 | ORAL_TABLET | Freq: Every day | ORAL | 5 refills | Status: DC
Start: 2020-05-08 — End: 2020-07-10

## 2020-05-08 MED ORDER — COVID-19 MRNA MONOVALENT VACCINE (PFIZER) 30 MCG/0.3ML IM (WRAPPER)
30.0000 ug | Freq: Once | INTRAMUSCULAR | Status: DC
Start: 2020-05-08 — End: 2020-05-09

## 2020-05-08 MED ORDER — DOLUTEGRAVIR SODIUM 50 MG OR TABS
50.0000 mg | ORAL_TABLET | Freq: Every day | ORAL | 5 refills | Status: DC
Start: 2020-05-08 — End: 2020-07-10

## 2020-05-08 NOTE — Progress Notes (Signed)
[facility]  FOLLOW-UP VISIT  05/08/2020    ID/CC: Dennis [name] is a 30 year old man with HIV here to discuss anxiety.     ASSESSMENT/PLAN:   1. Neutropenia, unspecified type: Monitoring -- ANC 860 today; stable btwn [phone] over last several checks.  - CBC with Diff; Future    2. HIV disease [facility]): Excellent reported adherence to DTG + F/TAF despite recent mental health challenges. Has standing orders for q69mo monitoring per his request. Will get COVID booster today as well. (Ordered but not administered?)  - CBC with Diff; Future  - COVID-19 mRNA vaccine (Pfizer) injection 30 mcg  - dolutegravir 50 MG tablet; Take 1 tablet (50 mg) by mouth daily.  Dispense: 30 tablet; Refill: 5  - emtricitabine-tenofovir alafenamide (Descovy) 200-25 MG tablet; Take 1 tablet by mouth daily.  Dispense: 30 tablet; Refill: 5    3. Adjustment disorder with anxiety: Sx seem to be related to grief re: recent ended relationship and change that brought about including having to move to a new place to live alone, etc. He had been stuffing down emotions and felt some catharsis today in discussing them aloud. Would like to meet with a black therapist -- will work with SW to find someone to refer.  Discussed idea of initiating medication as adjunct tx -- he'd like to avoid sexual dysfunction and be on something qd so will go with duloxetine. Effect should be fairly quick, but he will let me know if no change in sx in 3-4 weeks.  - DULoxetine 30 MG DR capsule; Take 1 capsule (30 mg) by mouth daily for 7 days, THEN 2 capsules (60 mg) daily for 21 days.  Dispense: 49 capsule; Refill: 0    Return to clinic/follow-up: 3 months    I spent a total of 39 minutes for the patient's care on the date of the service.      _____________________________  HISTORY OF PRESENT ILLNESS: Dennis [name] is feeling more anxious recently, struggling with the recent big changes in his life. Now living alone in the [address] after breaking up with his boyfriend  and his routine is very different. Often has anorexia, remembers to eat when he's no longer triggered by these anxious thoughts. 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 Got back today from his annual friends trip in [place] (his first time visiting) and had a great time. Interested in meeting with a therapist to discuss these issues - ideally someone who identifies as Black - and trying a new medication for it. Says anxiety and grief haven't affected his ART adherence -- reports no missed doses at all. Will get a COVID booster today and wants to keep q70mo STI/HIV monitoring.     REVIEW OF SYSTEMS:   I performed a complete review of systems today and have documented the patient's positive and pertinent negative complaints above. All remaining systems are negative.     PHYSICAL EXAMINATION:   Vital Signs: BP 110/69    Pulse 64    Temp (!) 35.9 C (Temporal)    Resp 16    Wt 80.3 kg (177 lb)    SpO2 98%    BMI 24.01 kg/m     Constitutional: Well-appearing, well-groomed, in no distress  Eyes: anicteric, EOMI and conjunctivae clear  Lungs: Unlabored breathing  Heart: Normal rate, no edema  Neuro: A&Ox3, normal motor function, normal gait  Psych: "Calm," congruent affect, good insight, briefly tearful when talking about  triggers for anxiety    Family History:  No known medical problems in family including HTN, DM, cancers    I personally reviewed and verified the documented history with the patient and agree with it.    HIV General Healthcare Maintenance  Cardiovascular/metabolic  Lipids: nl in 07/2017  HTN: nl screens at last4visits  Glucose control: A1c nl 07/2017  UA: microhematuria in 07/2017--> resolved on repeat UA 10/2017. Trace proteinuria 01/2019. Normal exam 08/2019    Sexually transmitted infections  Syphilis: serofast RPR 1:4 in 02/2017 after tx for early syphilis in 01/2016 (initial RPR 1:32) --> 1:1 in 10/2017 (likely new serofast titer) --> 1:1in3/2020, 08/2018, 01/2019, 08/2019 -->  1:4 in 01/2020 (treated for EL syphilis with BIC x1)  GC/CT: neg screen x3 in10/2019, 08/2018, 01/2020    Immunizations/Serologies  Quantiferon neg 01/2016  Toxo neg 01/2016  Previously declinedflu, Menveo and HPV vaccines but interested to receive TDaP and COVID boosters    Immunization History   Administered Date(s) Administered    COVID-19 Pfizer mRNA purple cap 01/28/2019, 02/17/2019    Influenza quadrivalent PF 02/08/2016, 10/10/2016    Pneumococcal conjugate PCV13 (Prevnar 13) 05/02/2016    Pneumococcal polysaccharide PPSV23 (Pneumovax 23) 10/10/2016   Pended Date(s) Pended    COVID-19 Pfizer mRNA purple cap 05/08/2020

## 2020-05-08 NOTE — Progress Notes (Signed)
Call received from lab to report critical ANC of 0.86.  Information shared with provider.

## 2020-05-09 LAB — GC&CHLAM NUCLEIC ACID DETECTN
Chlam Trachomatis Nucleic Acid: NEGATIVE
Chlam Trachomatis Nucleic Acid: NEGATIVE
Chlam Trachomatis Nucleic Acid: NEGATIVE
N.Gonorrhoeae(GC) Nucleic Acid: NEGATIVE
N.Gonorrhoeae(GC) Nucleic Acid: NEGATIVE
N.Gonorrhoeae(GC) Nucleic Acid: NEGATIVE

## 2020-05-11 ENCOUNTER — Encounter (HOSPITAL_BASED_OUTPATIENT_CLINIC_OR_DEPARTMENT_OTHER): Payer: Self-pay | Admitting: Counselor

## 2020-05-11 NOTE — Progress Notes (Signed)
DOCUMENT TYPE: Social Work - Follow-up Note    PATIENT: Dennis [name], Dennis [name]   MRN: [id number]  DOB: 01/04/1991    ENCOUNTER DATE:  05/11/2020    ASSOCIATED PROGRAM-CLINIC:  [facility]    CONTACT TYPE:  Telephone    BRIEF DESCRIPTION:  Follow up re: therapy    PATIENT DESCRIPTION, PRESENTING PROBLEM, PATIENT / FAMILY GOALS:  Pt is 30 year old African American gay male with HIV, CD4 count 412, VL undetectable, on ART meds established HIV care and case management at [facility] clinic. Pt diagnosed with HIV on 03/21/2014 previous provider Dr. Staci Righter at [place], [place]. His family does not know about his diagnosis, but some his friends know. Pt's CM/SW is [name] who is out; this SW is covering.    As agreed SW called pt to follow up re: mental health resources.    INTERVENTION / OUTCOME / PLAN:  Pt was unable to talk as he was at work but agreed to allow SW to e-mail him resources. As per our conversation, pt preferred to work with a Editor, commissioning. SW was unable to find one that accepted pt's insurance and was taking new pts. SW e-mailed pt three resources that offer therapist of color and sliding scale services. SW emailed [facility], [facility] and [facility] [email or web address]. SW asked pt to let her know if he is able to locate a therapist through this resources.    Plan  SW e-mailed pt three therapist sites for pt to find a therapist.  SW will remain available as needed for support.    DOCUMENT COMPOSED BY:   Gretta Began, MSW [phone] on 05/11/2020

## 2020-05-12 LAB — HIV1 RNA QUANTITATION: HIV RNA Result: NOT DETECTED {copies}/mL

## 2020-05-17 LAB — REFERENCE LABORATORY TEST 1: Reference Lab: Result 1 (Sendout): REACTIVE

## 2020-05-17 LAB — RPR QUANTITATIVE (SENDOUT): RPR Quantitative (Sendout): 1:1 {titer} — AB

## 2020-06-05 ENCOUNTER — Ambulatory Visit: Payer: 59 | Attending: Registered Nurse

## 2020-06-05 ENCOUNTER — Other Ambulatory Visit (HOSPITAL_BASED_OUTPATIENT_CLINIC_OR_DEPARTMENT_OTHER): Payer: Self-pay | Admitting: Family Medicine

## 2020-06-05 ENCOUNTER — Other Ambulatory Visit (HOSPITAL_BASED_OUTPATIENT_CLINIC_OR_DEPARTMENT_OTHER): Payer: Self-pay

## 2020-06-05 DIAGNOSIS — J029 Acute pharyngitis, unspecified: Secondary | ICD-10-CM

## 2020-06-05 DIAGNOSIS — R070 Pain in throat: Secondary | ICD-10-CM

## 2020-06-05 DIAGNOSIS — Z202 Contact with and (suspected) exposure to infections with a predominantly sexual mode of transmission: Secondary | ICD-10-CM | POA: Insufficient documentation

## 2020-06-05 DIAGNOSIS — Z20822 Contact with and (suspected) exposure to covid-19: Secondary | ICD-10-CM | POA: Insufficient documentation

## 2020-06-05 MED ORDER — DOXYCYCLINE MONOHYDRATE 100 MG OR CAPS
100.0000 mg | ORAL_CAPSULE | Freq: Two times a day (BID) | ORAL | 0 refills | Status: AC
Start: 2020-06-05 — End: 2020-06-12
  Filled 2020-06-05: qty 14, 7d supply, fill #0

## 2020-06-05 MED ORDER — CEFTRIAXONE SODIUM 500 MG IJ SOLR
INTRAMUSCULAR | Status: AC
Start: 2020-06-05 — End: ?
  Filled 2020-06-05: qty 500

## 2020-06-05 MED ORDER — CEFTRIAXONE SODIUM 500 MG IJ SOLR
500.0000 mg | Freq: Once | INTRAMUSCULAR | Status: AC
Start: 2020-06-05 — End: 2020-06-05
  Administered 2020-06-05: 500 mg via INTRAMUSCULAR

## 2020-06-06 LAB — GC&CHLAM NUCLEIC ACID DETECTN
Chlam Trachomatis Nucleic Acid: NEGATIVE
Chlam Trachomatis Nucleic Acid: NEGATIVE
Chlam Trachomatis Nucleic Acid: NEGATIVE
N.Gonorrhoeae(GC) Nucleic Acid: NEGATIVE
N.Gonorrhoeae(GC) Nucleic Acid: NEGATIVE
N.Gonorrhoeae(GC) Nucleic Acid: NEGATIVE

## 2020-06-06 LAB — COVID-19 CORONAVIRUS QUALITATIVE PCR: COVID-19 Coronavirus Qual PCR Result: NOT DETECTED

## 2020-06-06 NOTE — Progress Notes (Signed)
[facility] 06/06/2020   STI TESTING    Dennis [name] presents to nursing for STI testing. Reviewed name, DOB, and allergies.   Patient confirmed name and date of birth.    CHART REVIEW  Patient is HIV positive: YES  Patient has history of syphilis: YES    SUBJECTIVE  Patient is here for routine testing: NO  Patient reports a known exposure: no  Patient reports the following symptoms of STI: throat discomfort and swollen/red spots on tonsils    ASSESSMENT  Patient is appropriate for: self testing, treatment of gonorrhea and treatment of chlamydia   Patient is appropriate for SURRG testing: NO  STI testing orders placed under Dr Janit Bern: YES    PLAN/TREATMENT  Patient provided urine specimen, and self-collected rectal and pharyngeal swabs. COVID swab obtained in clinic as well given throat discomfort.    Orders submitted this visit:  Orders Placed This Encounter   . GC & CT Nucleic Acid Detection - Urine   . GC & CT Nucleic Acid Detection - Rectal   . GC & CT Nucleic Acid Detection - Throat   . COVID-19 Coronavirus Qualitative PCR     Pt additionally picked up doxycycline prescription from pharmacy to take if Chlamydia +.   Medication Administrations This Visit       cefTRIAXone (Rocephin) injection 500 mg Admin Date  06/05/2020  12:17 Action  Given Dose  500 mg Route  Intramuscular Site  Left Deltoid Administered By  Edison Simon, RN    Ordering Provider: Talmage Nap, MD         Patient was encouraged to call clinic or check MyChart for results.    RECOMMENDATIONS  . Patient was encouraged to return in 3 months or sooner if needed for follow-up testing: YES  . Patient advised to notify any sexual partners of exposure, or contact [facility] at [phone] for anonymous notification: yes  . Pt educated not to have sex for the next 7 days: yes         Edison Simon, RN 06/06/2020

## 2020-06-07 ENCOUNTER — Other Ambulatory Visit (HOSPITAL_BASED_OUTPATIENT_CLINIC_OR_DEPARTMENT_OTHER): Payer: Self-pay | Admitting: Infectious Disease

## 2020-06-07 DIAGNOSIS — F4322 Adjustment disorder with anxiety: Secondary | ICD-10-CM

## 2020-06-07 MED ORDER — DULOXETINE HCL 60 MG OR CPEP
60.0000 mg | DELAYED_RELEASE_CAPSULE | Freq: Every day | ORAL | 1 refills | Status: DC
Start: 2020-06-07 — End: 2020-07-10

## 2020-06-08 ENCOUNTER — Encounter (HOSPITAL_BASED_OUTPATIENT_CLINIC_OR_DEPARTMENT_OTHER): Payer: Self-pay | Admitting: Infectious Disease

## 2020-06-21 ENCOUNTER — Encounter (HOSPITAL_BASED_OUTPATIENT_CLINIC_OR_DEPARTMENT_OTHER): Payer: Self-pay | Admitting: Infectious Disease

## 2020-06-21 ENCOUNTER — Ambulatory Visit: Payer: 59 | Attending: Infectious Disease | Admitting: Infectious Disease

## 2020-06-21 ENCOUNTER — Other Ambulatory Visit (HOSPITAL_BASED_OUTPATIENT_CLINIC_OR_DEPARTMENT_OTHER): Payer: Self-pay | Admitting: Infectious Disease

## 2020-06-21 VITALS — BP 121/80 | HR 56 | Temp 98.6°F | Wt 182.0 lb

## 2020-06-21 DIAGNOSIS — Z23 Encounter for immunization: Secondary | ICD-10-CM | POA: Insufficient documentation

## 2020-06-21 DIAGNOSIS — Z0000 Encounter for general adult medical examination without abnormal findings: Secondary | ICD-10-CM

## 2020-06-21 DIAGNOSIS — R070 Pain in throat: Secondary | ICD-10-CM | POA: Insufficient documentation

## 2020-06-21 MED ORDER — COVID-19 MRNA MONOVALENT VACCINE (PFIZER) 30 MCG/0.3ML IM (WRAPPER)
30.0000 ug | Freq: Once | INTRAMUSCULAR | Status: AC
Start: 2020-06-21 — End: 2020-06-21
  Administered 2020-06-21: 30 ug via INTRAMUSCULAR

## 2020-06-21 NOTE — Progress Notes (Signed)
Medication Administrations This Visit       COVID-19 mRNA vaccine (Pfizer) injection 30 mcg Admin Date  06/21/2020  15:55 Action  Given Dose  30 mcg Route  Intramuscular Site  Left Deltoid Administered By  Michele Mcalpine, CMA    Ordering Provider: Wyvonnia Lora, MD    NDC: [phone]    Lot#: [id number]

## 2020-06-21 NOTE — Progress Notes (Signed)
CLINIC VISIT / URGENT CARE APPOINTMENT    ID/CC:  Dennis [name] is a 30 year old year old male here for an urgent care appt.     Reason for Visit: Throat discomfort/sensation.     PCP: Dr. Liana Crocker    HPI:  Dennis [name] is a 30 year old gentleman with well-controlled HIV who presents with throat discomfort.  He reports that at baseline, for the past few years, he has had the sensation that there is something on the left side of her throat, inside his mouth, and he is not able to feel it externally through his neck.  This was not something that caused any pain but rather just a sense that there is something there.  However, about 2 weeks ago he developed pain and swelling in that very area.  He underwent COVID testing which was negative as well as STD testing, also negative.  He got a dose of ceftriaxone for empiric treatment of throat gonorrhea and was also prescribed a course of doxycycline which she did not take.  Today, he reports that he is back to baseline, meaning that he has that discomfort he has previously had without the acute swelling he has now recovered from.  Today, he feels completely normal except for the above.  No fevers, chills, no difficulty swallowing, no GERD, or rashes.     PHYSICAL EXAM  Vitals: BP 121/80   Pulse (!) 56   Temp 37 C (Tympanic)   Wt 82.6 kg (182 lb)   SpO2 99%   BMI 24.68 kg/m    Wt Readings from Last 3 Encounters:   06/21/20 82.6 kg (182 lb)   05/08/20 80.3 kg (177 lb)   02/07/20 74.8 kg (165 lb)     GEN: Comfortable, NAD  HEENT: OP with mild edema on his posterior pharynx (left side). Cervical lymph nodes are prominent but within normal size.   RESP: CTAB  EXT: No edema.  SKIN: No obvious wounds or rashes.     ASSESSMENT & PLAN  Dennis [name] was seen today for throat problem.    Diagnoses and all orders for this visit:    Throat pain: Unclear etiology.  His pharyngeal exam is not normal but is not typical of someone with an active acute infection.  Nonetheless, would  like to rule out strep as well as gonorrhea.  Depending on these results, it might be worth considering an ENT referral for further examination.  The patient appears to be nontoxic.  -     Culture Strep, Beta (Groups A, C, G); Future  -     GC and Chlam Nucleic Acid Detection; Future    Healthcare maintenance: We discussed the importance of getting up-to-date on his COVID vaccines and he would like to proceed with a [employer] booster today.  -     COVID-19 mRNA vaccine (Pfizer) injection 30 mcg    Wyvonnia Lora, MD, MPH  Infectious Diseases  [phone]    I spent a total of 25 minutes, including the time spent reviewing the patient's chart before the visit, seeing the patient,discussing the above-mentioned medical conditions, and outlining next steps.       SUPPLEMENTAL INFORMATION    Problem List  Patient Active Problem List    Diagnosis Date Noted   . Other neutropenia (HCC) 08/30/2018   . HIV disease (HCC) 11/13/2017       Medications  Current Outpatient Medications   Medication Sig Dispense Refill   . dolutegravir 50 MG tablet  Take 1 tablet (50 mg) by mouth daily. 30 tablet 5   . DULoxetine 60 MG DR capsule Take 1 capsule (60 mg) by mouth daily. 30 capsule 1   . emtricitabine-tenofovir alafenamide (Descovy) 200-25 MG tablet Take 1 tablet by mouth daily. 30 tablet 5     No current facility-administered medications for this visit.       Recent/Relevant Labs  Orders Only on 06/05/20   1. GC & CT Nucleic Acid Detection - Throat   Result Value Ref Range    GC&Chlam NA Spec Desc Throat     Chlam Trachomatis Nucleic Acid Negative NRN    N.Gonorrhoeae(GC) Nucleic Acid Negative NRN   2. GC & CT Nucleic Acid Detection - Rectal   Result Value Ref Range    GC&Chlam NA Spec Desc Rectal     Chlam Trachomatis Nucleic Acid Negative NRN    N.Gonorrhoeae(GC) Nucleic Acid Negative NRN   3. GC & CT Nucleic Acid Detection - Urine   Result Value Ref Range    GC&Chlam NA Spec Desc Urine     Chlam Trachomatis Nucleic Acid Negative NRN     N.Gonorrhoeae(GC) Nucleic Acid Negative NRN   4. COVID-19 Coronavirus Qualitative PCR   Result Value Ref Range    COVID-19 Coronavirus Qual PCR Specimen Type Nasal swab     COVID-19 Coronavirus Qual PCR Result None detected NDET    COVID-19 Coronavirus Qual PCR Interpretation       This is a negative result. Laboratory testing alone cannot rule out infection, particularly in the presence of clinical risk factors such as symptoms or exposure history.       HCM  Immunizations: )  Immunization History   Administered Date(s) Administered   . COVID-19 Pfizer mRNA purple cap 01/28/2019, 02/17/2019   . COVID-19 Pfizer mRNA tris-sucrose gray cap 06/21/2020, 06/21/2020   . Influenza quadrivalent PF 02/08/2016, 10/10/2016   . Pneumococcal conjugate PCV13 (Prevnar 13) 05/02/2016   . Pneumococcal polysaccharide PPSV23 (Pneumovax 23) 10/10/2016

## 2020-06-22 LAB — GC&CHLAM NUCLEIC ACID DETECTN
Chlam Trachomatis Nucleic Acid: NEGATIVE
N.Gonorrhoeae(GC) Nucleic Acid: NEGATIVE

## 2020-06-24 LAB — R/O BETA STREP CULTURE

## 2020-06-28 ENCOUNTER — Telehealth (HOSPITAL_BASED_OUTPATIENT_CLINIC_OR_DEPARTMENT_OTHER): Payer: Self-pay | Admitting: Infectious Disease

## 2020-06-28 DIAGNOSIS — J020 Streptococcal pharyngitis: Secondary | ICD-10-CM

## 2020-06-28 MED ORDER — AMOXICILLIN 500 MG OR CAPS
500.0000 mg | ORAL_CAPSULE | Freq: Two times a day (BID) | ORAL | 0 refills | Status: AC
Start: 2020-06-28 — End: 2020-07-08

## 2020-06-28 NOTE — Telephone Encounter (Signed)
I called the patient to let him know that his throat culture is positive for Group A Strep. Unfortunately, he didn't pick up. I'll try again today. I sent amoxicillin 500mg  po BID x 10 days to his .

## 2020-07-10 ENCOUNTER — Encounter (HOSPITAL_BASED_OUTPATIENT_CLINIC_OR_DEPARTMENT_OTHER): Payer: Self-pay | Admitting: Counselor

## 2020-07-10 ENCOUNTER — Ambulatory Visit: Payer: 59 | Attending: Infectious Disease | Admitting: Infectious Disease

## 2020-07-10 VITALS — BP 114/69 | HR 68 | Temp 98.8°F | Resp 16 | Wt 172.8 lb

## 2020-07-10 DIAGNOSIS — F4322 Adjustment disorder with anxiety: Secondary | ICD-10-CM

## 2020-07-10 DIAGNOSIS — B20 Human immunodeficiency virus [HIV] disease: Secondary | ICD-10-CM | POA: Insufficient documentation

## 2020-07-10 DIAGNOSIS — R070 Pain in throat: Secondary | ICD-10-CM | POA: Insufficient documentation

## 2020-07-10 MED ORDER — DESCOVY 200-25 MG OR TABS
1.0000 | ORAL_TABLET | Freq: Every day | ORAL | 5 refills | Status: DC
Start: 2020-07-10 — End: 2020-11-06

## 2020-07-10 MED ORDER — DOLUTEGRAVIR SODIUM 50 MG OR TABS
50.0000 mg | ORAL_TABLET | Freq: Every day | ORAL | 5 refills | Status: DC
Start: 2020-07-10 — End: 2020-11-06

## 2020-07-10 MED ORDER — DULOXETINE HCL 60 MG OR CPEP
60.0000 mg | DELAYED_RELEASE_CAPSULE | Freq: Every day | ORAL | 5 refills | Status: DC
Start: 2020-07-10 — End: 2021-02-12

## 2020-07-10 NOTE — Progress Notes (Signed)
[name]  FOLLOW-UP VISIT  07/10/2020    ID/CC: Dennis [name] is a 30 year old man with well-controlled HIV here to f/u throat discomfort.    ASSESSMENT/PLAN:   1. Throat pain: Reviewed history/exam as below. Unclear significance of post pharynx hyperemia. We discussed possible etiologies including smoking (though sx preceded start of this), primary HSV infection causing pharyngitis, occult/subclinical GERD. Recommend he do trial of smoking cessation. He politely declined offer of empiric VCV and/or H2B. He was interested in being referred to ENT for further evaluation.   - Referral to [facility]; Future    2. Adjustment disorder with anxiety: Continue; doing well and responding positively.   - DULoxetine 60 MG DR capsule; Take 1 capsule (60 mg) by mouth daily.  Dispense: 30 capsule; Refill: 5    3. HIV disease (HCC): Excellent reported adherence to DTG + F/TAF. Has standing orders for q75mo monitoring per his request.   - dolutegravir 50 MG tablet; Take 1 tablet (50 mg) by mouth daily.  Dispense: 30 tablet; Refill: 5  - emtricitabine-tenofovir alafenamide (Descovy) 200-25 MG tablet; Take 1 tablet by mouth daily.  Dispense: 30 tablet; Refill: 5    Return to clinic/follow-up: 4 months or sooner as needed  _____________________________  HISTORY OF PRESENT ILLNESS:   Dennis [name] is here for f/u of throat discomfort. This started about 3-4 weeks ago, was initially pretty mild then worsened. He saw Dr. Suezanne Jacquet on 6/15 where GC/CT of pharynx was negative but he was diagnosed with GAS+ by cx. He did pick up and begin the amoxicillin prescribed. Maybe this has helped? Currently discomfort is rated 1.5/10 in severity, worse and more noticeable when yawning. Denies any odynophagia, dysphagia, neck stiffness, nasal congestion or recent URI, headaches, n/v. Of note, he did begin smoking marijuana again after the onset of this discomfort -- very occasional, maybe a few times per week. Otherwise isn't around  secondhand smoke, denies sleeping under fan or cold air, no systemic signs/sx of mono. Never had HSV before that he knows and hasn't had any oral/genital outbreaks. Doesn't have early morning acidic taste in mouth, trouble eating spicy or acidic foods, or epigastric pain to suggest GERD.    Of note, he feels the duloxetine is working well and mood is improved at current dosage. Taking ART as directed with no missed doses or side effects.    REVIEW OF SYSTEMS:   I performed a complete review of systems today and have documented the patient's positive and pertinent negative complaints above. All remaining systems are negative.     PHYSICAL EXAMINATION:   Vital Signs: BP 114/69    Pulse 68    Temp 37.1 C (Temporal)    Resp 16    Wt 78.4 kg (172 lb 12.8 oz)    SpO2 99%    BMI 23.44 kg/m     Constitutional:Well-appearing, well-groomed, in no distress  HEENT: Anicteric, EOMI and conjunctivae clear. Tonsils mildly enlarged but no exudate. Posterior pharynx with increased prominence of mucosal vessels (injection?) L>R but no overt erythema. Normal dentition. Unable to visualize TMs bilat 2/2 cerumen impaction  Neck: Normal thyroid with no nodules, supple, no meningismus  Lymph: No cervical, SC or axillary nodes palpable  Lungs:Unlabored breathing. CTA throughout  Heart:Normal rate,no edema  Neuro: A&Ox3, normal motor function, normal gait  Psych: "Calm," congruent affect, good insight    Family History:  No known medical problems in family including HTN, DM, cancers    I personally reviewed  and verified the documented history with the patient andagree with it.    HIV General Healthcare Maintenance  Cardiovascular/metabolic  Lipids: nl in 07/2017  HTN: nl screens at last4visits  Glucose control: A1c nl 07/2017  UA: microhematuria in 07/2017--> resolved on repeat UA 10/2017. Trace proteinuria 01/2019. Normal exam 08/2019    Sexually transmitted infections  Syphilis: serofast RPR 1:4 in 02/2017 after tx for early  syphilis in 01/2016 (initial RPR 1:32) --> 1:1 in 10/2017 (likely new serofast titer) --> 1:1in3/2020, 08/2018, 01/2019, 08/2019 --> 1:4 in 01/2020 (treated for EL syphilis with BIC x1) --> back down to serofast titer 1:1 in 04/2020  GC/CT: neg screen x3 in10/2019, 08/2018, 01/2020, 05/2020    Cancer screening  Lung: N/A  Anal: Discuss in future as needed    Immunizations/Serologies  Quantiferon neg 01/2016  Toxo neg 01/2016  Previously declinedflu, Menveo and HPV vaccinesbut interested to receive TDaP and COVID boosters    Immunization History   Administered Date(s) Administered    COVID-19 Pfizer mRNA 12 yrs and older (purple cap) 01/28/2019, 02/17/2019    COVID-19 Pfizer mRNA tris-sucrose 12 yrs and older (gray cap) 06/21/2020    Influenza quadrivalent PF 02/08/2016, 10/10/2016    Pneumococcal conjugate PCV13 (Prevnar 13) 05/02/2016    Pneumococcal polysaccharide PPSV23 (Pneumovax 23) 10/10/2016

## 2020-07-10 NOTE — Progress Notes (Signed)
DOCUMENT TYPE: Social Work - Follow-up Note    PATIENT: Nolon, Yellin   MRN: [id number]  DOB: 1990/07/09    ENCOUNTER DATE:  07/10/2020    ASSOCIATED PROGRAM-CLINIC:  [facility]    CONTACT TYPE:  Face to Face - Office    BRIEF DESCRIPTION:  Check-in post appointment    PATIENT DESCRIPTION, PRESENTING PROBLEM, PATIENT / FAMILY GOALS:  Pt is 30 year old African American gay male with HIV, CD4 count 412, VL undetectable, on ART meds established HIV care and case management at [facility] clinic. Pt diagnosed with HIV on 03/21/2014 previous provider Dr. Scharlene Gloss at [place], [place]. His family does not know about his diagnosis, but some his friends know. Pt's CM/SW is Nicholes Calamity who is out; this SW is covering.    SW met with pt after his appointment to check-in    INTERVENTION / OUTCOME / PLAN:  Pt recently moved and is trying to get settle. He mentions the move is a good thing. Pt has not been able to follow up with the mental referral given but he plans to contact providers in the upcoming weeks. Pt agreed to contact SW if he has difficulty accessing services.    Plan  SW will remain available as needed for support.    DOCUMENT COMPOSED BY:   Denton Ar, MSW [phone] on 07/10/2020

## 2020-07-12 ENCOUNTER — Encounter (HOSPITAL_BASED_OUTPATIENT_CLINIC_OR_DEPARTMENT_OTHER): Payer: Self-pay | Admitting: Family

## 2020-08-21 ENCOUNTER — Other Ambulatory Visit (HOSPITAL_BASED_OUTPATIENT_CLINIC_OR_DEPARTMENT_OTHER): Payer: Self-pay | Admitting: Infectious Disease

## 2020-10-16 ENCOUNTER — Encounter (HOSPITAL_BASED_OUTPATIENT_CLINIC_OR_DEPARTMENT_OTHER): Payer: Self-pay | Admitting: Counselor

## 2020-10-16 NOTE — Progress Notes (Signed)
DOCUMENT TYPE: Social Work - Follow-up Note    PATIENT: Forrest, Jaroszewski   MRN: [id number]  DOB: 06-Aug-1990    ENCOUNTER DATE:  10/16/2020    ASSOCIATED PROGRAM-CLINIC:  [facility]    CONTACT TYPE:  Telephone    BRIEF DESCRIPTION:  EIP renewal    PATIENT DESCRIPTION, PRESENTING PROBLEM, PATIENT / FAMILY GOALS:  Pt is 30 year old African American gay male with HIV, CD4 count 412, VL undetectable, on ART meds established HIV care and case management at [facility] clinic. Pt diagnosed with HIV on 03/21/2014 previous provider Dr. Staci Righter at [place], [place]. His family does not know about his diagnosis, but some his friends know. Pt's CM/SW is [name] who is out; this SW is covering.    SW called pt to discuss EIP renewal    INTERVENTION / OUTCOME / PLAN:  SW reminded pt of role and availability. SW explained his EIP is expiring at the end of the month and offered to assist with renewal. Pt agrees to forward income information and asked that ROI's be e-mail for him to sign and return. SW e-mailed ROI's to pt.    Plan  Pt will return signed ROI's via e-mail and will forward his income.  SW will remain available as needed for support.    DOCUMENT COMPOSED BY:   Gretta Began, MSW [phone] on 10/16/2020

## 2020-10-24 ENCOUNTER — Ambulatory Visit: Payer: 59 | Attending: Otolaryngology | Admitting: Otolaryngology

## 2020-10-24 ENCOUNTER — Encounter (HOSPITAL_BASED_OUTPATIENT_CLINIC_OR_DEPARTMENT_OTHER): Payer: Self-pay | Admitting: Otolaryngology

## 2020-10-24 VITALS — BP 118/75 | HR 60 | Temp 97.7°F | Resp 15 | Ht 72.0 in | Wt 183.0 lb

## 2020-10-24 DIAGNOSIS — K219 Gastro-esophageal reflux disease without esophagitis: Secondary | ICD-10-CM | POA: Insufficient documentation

## 2020-10-24 DIAGNOSIS — R070 Pain in throat: Secondary | ICD-10-CM | POA: Insufficient documentation

## 2020-10-24 DIAGNOSIS — R490 Dysphonia: Secondary | ICD-10-CM | POA: Insufficient documentation

## 2020-10-24 DIAGNOSIS — J310 Chronic rhinitis: Secondary | ICD-10-CM | POA: Insufficient documentation

## 2020-10-24 NOTE — Progress Notes (Signed)
Encounter date:  10/24/2020    Consultation requested by: Jerlyn Ly, MD(ID)  Reason for consultation:  Throat pain  Chief Complaint   Patient presents with    Throat Problem     Throat pain     I appreciate the opportunity to be involved in this patients care.    HISTORY OF PRESENT ILLNESS:  This 30 year old with well-controlled HIV, MJ use, anxiety, here for throat discomfort  Points to mid throat area with neck pain, onset several months, insidious onset, usually lasting 1 week, varies, started on left, then middle then right side of neck.  Voice is deeper now than before, states the same.  Sometimes loses his voice briefly when he has neck pain  Reports mild seasonal allergies  No history of heartburn  No swallow, breathing issues  No history of facial neck trauma or surgeries  Denies facial sensory changes  Denies fever, chills, nausea, vomit, cough, sore throat, hemoptysis, otalgia, unintentional weight loss.      Questionnaire Scores:  Voice handicap index: 5/40  Reflux symptom index: 6/45  Eating assessment tool: 0/40        Past Medical History:  Patient Active Problem List   Diagnosis    HIV disease (HCC)    Other neutropenia (HCC)   Anxiety-well managed    Medications:   Current Outpatient Medications   Medication Sig Dispense Refill    dolutegravir 50 MG tablet Take 1 tablet (50 mg) by mouth daily. 30 tablet 5    DULoxetine 60 MG DR capsule Take 1 capsule (60 mg) by mouth daily. 30 capsule 5    emtricitabine-tenofovir alafenamide (Descovy) 200-25 MG tablet Take 1 tablet by mouth daily. 30 tablet 5     No current facility-administered medications for this visit.       Allergies:  Patient has no known allergies.    Social History:     reports that he has quit smoking. He does not have any smokeless tobacco history on file.  Lives alone  Smokes occasional marijuana  Works as [employer]     Family History:    Otherwise negative in relation to the chief complaint.    I have reviewed  the patient's recorded medical history including the social history and confirmed these with the patient.    REVIEW OF SYSTEMS:  A complete review of systems was performed and was positive for items noted in the HPI above with and on today's patient questionnaire with all other systems negative.    PHYSICAL EXAMINATION:  Vital Signs:  Blood pressure 118/75, pulse 60, temperature 36.5 C, temperature source Temporal, resp. rate 15, height 6' (1.829 m), weight 83 kg (183 lb), SpO2 100 %.  General:  no apparent distress.          Voice: deep, slightly rough  Eyes:  clear without icterus, EOMI  ENT:  Tympanic membranes and external auditory canals are Normal bilaterally, with L>R cerumen.  Oral cavity and oropharynx examination demonstrates no  mucosal lesions or masses.  Anterior rhinoscopy demonstrates Normal mucosa and no lesions.  Lymphatic:   no neck lymphadenopathy   Respiratory:   no respiratory distress,  no audible stridor nor wheeze.    Musculoskeletal:  Development Normal.  Neurologic:  Screening cranial nerve examination for nerves II-XII within Normal limits.   Psychiatric:  Appropriate mood/affect.    DIAGNOSTIC STUDIES:  Flexible endoscopic laryngoscopy with stroboscopy was performed with postcricoid edema and erythema, posterior pharyngeal cobblestoning, mild muscle tension  noted    ASSESSMENT AND PLAN:  In summary, this 30 year old with PMH of HIV, anxiety, has findings consistent with:    (R07.0) Throat pain  (primary encounter diagnosis)  Stroboscopy was performed by Dr. 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 Glendon Axe.  Thank you for the opportunity to participate in the care of this patient.  Please feel free to contact me with any questions or concerns.    Please note, this report was generated in part with the aid of voice recognition software.  Please call us with questions.

## 2020-10-26 ENCOUNTER — Other Ambulatory Visit: Payer: Self-pay

## 2020-11-06 ENCOUNTER — Ambulatory Visit: Payer: 59 | Attending: Infectious Disease | Admitting: Infectious Disease

## 2020-11-06 ENCOUNTER — Other Ambulatory Visit (HOSPITAL_BASED_OUTPATIENT_CLINIC_OR_DEPARTMENT_OTHER): Payer: Self-pay | Admitting: Infectious Disease

## 2020-11-06 ENCOUNTER — Encounter (HOSPITAL_BASED_OUTPATIENT_CLINIC_OR_DEPARTMENT_OTHER): Payer: Self-pay

## 2020-11-06 VITALS — BP 111/61 | HR 75 | Temp 97.9°F | Resp 18 | Wt 175.0 lb

## 2020-11-06 DIAGNOSIS — B20 Human immunodeficiency virus [HIV] disease: Secondary | ICD-10-CM

## 2020-11-06 DIAGNOSIS — F4322 Adjustment disorder with anxiety: Secondary | ICD-10-CM | POA: Insufficient documentation

## 2020-11-06 DIAGNOSIS — D709 Neutropenia, unspecified: Secondary | ICD-10-CM

## 2020-11-06 LAB — CBC, DIFF
% Basophils: 1 %
% Eosinophils: 1 %
% Immature Granulocytes: 0 %
% Lymphocytes: 45 %
% Monocytes: 11 %
% Neutrophils: 42 %
% Nucleated RBC: 0 %
Absolute Eosinophil Count: 0.05 10*3/uL (ref 0.00–0.50)
Absolute Lymphocyte Count: 1.7 10*3/uL (ref 1.00–4.80)
Basophils: 0.02 10*3/uL (ref 0.00–0.20)
Hematocrit: 39 % (ref 38.0–50.0)
Hemoglobin: 13.6 g/dL (ref 13.0–18.0)
Immature Granulocytes: 0.01 10*3/uL (ref 0.00–0.05)
MCH: 29.4 pg (ref 27.3–33.6)
MCHC: 34.9 g/dL (ref 32.2–36.5)
MCV: 84 fL (ref 81–98)
Monocytes: 0.42 10*3/uL (ref 0.00–0.80)
Neutrophils: 1.6 10*3/uL — ABNORMAL LOW (ref 1.80–7.00)
Nucleated RBC: 0 10*3/uL
Platelet Count: 205 10*3/uL (ref 150–400)
RBC: 4.63 10*6/uL (ref 4.40–5.60)
RDW-CV: 12.5 % (ref 11.0–14.5)
WBC: 3.8 10*3/uL — ABNORMAL LOW (ref 4.3–10.0)

## 2020-11-06 MED ORDER — DOLUTEGRAVIR SODIUM 50 MG OR TABS
50.0000 mg | ORAL_TABLET | Freq: Every day | ORAL | 5 refills | Status: DC
Start: 2020-11-06 — End: 2021-02-12

## 2020-11-06 MED ORDER — DESCOVY 200-25 MG OR TABS
1.0000 | ORAL_TABLET | Freq: Every day | ORAL | 5 refills | Status: DC
Start: 2020-11-06 — End: 2021-02-12

## 2020-11-06 NOTE — Progress Notes (Signed)
[facility]  FOLLOW-UP VISIT  11/06/2020    ID/CC: Dennis [name] is a 30 year old man with well-controlled HIV here for routine f/u visit.    ASSESSMENT/PLAN:   1. HIV disease (HCC): Excellent reported adherence to DTG + F/TAF. Has standing orders for q53mo monitoring per his request.   - emtricitabine-tenofovir alafenamide (Descovy) 200-25 MG tablet; Take 1 tablet by mouth daily.  Dispense: 30 tablet; Refill: 5  - dolutegravir 50 MG tablet; Take 1 tablet (50 mg) by mouth daily.  Dispense: 30 tablet; Refill: 5  - T Cell Subsets - CD4 & CD8 Only; Future    2. Neutropenia, unspecified type: Asymptomatic, monitoring q3-6 months. If ANC >1000 can space to yearly.   - CBC with Diff; Future    3. Adjustment disorder with anxiety, r/o MDD in early remission: Offered supportive listening today and congratulated the personal growth I've seen in him with setting healthy boundaries in interpersonal relationships. Responding well to psychotherapy and may have had some adjunctive effect from duloxetine. Side effect of sleepiness has been reported -- advised to do trial of taking qhs to see if this helps.     Return to clinic/follow-up: 3 months    I spent a total of 38 minutes for the patient's care on the date of the service.        _____________________________  HISTORY OF PRESENT ILLNESS:   Dennis [name] says life is interesting recently and several changes are happening. His boss and a close colleague were let go from his job in a round of lay-offs. He's not too concerned for now it'll happen to him but does cause some anxiety. Decided to lock his hair up. He is seeing a therapist virtually, and although he would prefer to have in person and longer sessions, feels he's getting some benefit from it. Did take the duloxetine for a time (and maybe found some benefit?), then stopped out of concern of DDI with ART and because taking doses in the morning made him very sleepy at lunch time. He's willing to give it another go.      Also states he is back in contact with his ex; they're amicable and he's been clearer about their relationship right now: "I've drawn the boundary to where he can't be a burden." Got 2nd MPX vax at a local [facility] with [employer]. He is otherwise feeling physically well and reports no issues with ART. Still a bit frustrated that there is no convincing etiology of his intermittent throat issues - saw ENT and was told it's LPR, lifestyle changes and voice therapy were recommended but he doesn't feel these are necessary or compatible with his life's schedule. Will try to come in again next time sx recur to see if an exam would be useful while symptomatic.    REVIEW OF SYSTEMS:   I performed a complete review of systems today and have documented the patient's positive and pertinent negative complaints above. All remaining systems are negative.     PHYSICAL EXAMINATION:   Vital Signs: BP 111/61    Pulse 75    Temp 36.6 C (Temporal)    Resp 18    Wt 79.4 kg (175 lb)    SpO2 98%    BMI 23.73 kg/m     Constitutional:Well-appearing, well-groomed, in no distress  Eyes: anicteric, EOMI and conjunctivae clear  Lungs:Unlabored breathing  Heart:Normal rate,no edema  Neuro: A&Ox3, normal motor function, normal gait  Psych: Calm and not depressed, congruent affect, good insight  Family History:  No known medical problems in family including HTN, DM, cancers    I personally reviewed and verified the documented history with the patient andagree with it.    HIV General Healthcare Maintenance  Cardiovascular/metabolic  Lipids: nl in 07/2017, 10/2017  HTN: nl screens at last4visits  Glucose control: A1c nl 07/2017  UA: microhematuria in 07/2017--> resolved on repeat UA 10/2017. Trace proteinuria 01/2019. Normal exam 08/2019    Sexually transmitted infections  Syphilis: serofast RPR 1:4 in 02/2017 after tx for early syphilis in 01/2016 (initial RPR 1:32) --> 1:1 in 10/2017 (likely new serofast titer) --> 1:1in3/2020, 08/2018,  01/2019, 08/2019 --> 1:4 in 01/2020 (treated for EL syphilis with BIC x1) --> back down to serofast titer 1:1 in 04/2020  GC/CT: neg screen x3 in10/2019, 08/2018, 01/2020, 05/2020 (then throat neg 06/2020 - tested at time of sore throat)    Cancer screening  Lung: N/A  Anal: Discuss in future as needed    Immunizations/Serologies Previously declinedflu, Menveo and HPV vaccinesbut interested to receive TDaP at another date. Declined bivalent COVID booster  Quantiferon neg 01/2016  Toxo neg 01/2016  HAV immune 01/2016  HBsAb pos (titer 82), sAg neg, cAb neg 01/2016  HCV NR 02/2017, 04/2020    Immunization History   Administered Date(s) Administered    COVID-19 Pfizer mRNA 12 yrs and older (purple cap) 01/28/2019, 02/17/2019    COVID-19 Pfizer mRNA tris-sucrose 12 yrs and older (gray cap) 06/21/2020    Influenza quadrivalent PF 02/08/2016, 10/10/2016    Pneumococcal conjugate PCV13 (Prevnar 13) 05/02/2016    Pneumococcal polysaccharide PPSV23 (Pneumovax 23) 10/10/2016    Vaccinia smallpox/monkeypox live 09/03/2020, 10/07/2020

## 2020-11-07 LAB — T CELL SUBSETS CD4/CD8 ONLY COUNTS
% CD4: 37 % (ref 33–61)
% CD8: 31 % (ref 14–35)
Abs CD4 Lymphocyte Cnt: 0.551 10*3/uL — ABNORMAL LOW (ref 0.730–2.250)
Abs CD8 Lymphocyte Cnt: 0.463 10*3/uL (ref 0.250–1.240)
CD4/CD8 Ratio: 1.19 (ref 1.00–3.78)

## 2020-11-08 NOTE — Progress Notes (Signed)
DOCUMENT TYPE: Social Work - Follow-up Note    PATIENT: Dennis [name], Dennis [name]   MRN: [id number]  DOB: [birth year]    ENCOUNTER DATE:  11/06/2020    ASSOCIATED PROGRAM-CLINIC:  [facility]    CONTACT TYPE:  Face to Face - Office    BRIEF DESCRIPTION:  EIP; EHIP forms; ROI; LDP; Pending proof of income    PATIENT DESCRIPTION, PRESENTING PROBLEM, PATIENT / FAMILY GOALS:  Pt is 30 year old African American gay male with HIV, CD4 count 412, VL undetectable, on ART meds established HIV care and case management at [facility] clinic. Pt diagnosed with HIV on 03/21/2014 previous provider Dr. Scharlene Gloss at [place], [place]. His family does not know about his diagnosis, but some his friends know. Pt's CM/SW is Nicholes Calamity who is out; this SW is covering.    Pt is in clinic today for medical appt. SW met with pt to discuss renewals. Pt's EIP is to expire on 10/31 and pt's LDP also needs to be renewed.     Pt informed that he is currently employed, although many of his coworkers were laid off and as a result he now has a new boss and that has been somewhat stressful for pt.     Pt completed EIP renewal application. Pt agreed to email this Probation officer proof of income. Pt aware that EIP will expire on 10/31.     Pt also completed EHIP forms for upcoming open enrollment. Forms in pt's chart.     LDP renewal form was also completed. SW will finalize and submit to [employer].     ROI also completed by pt.     Pt denied having any other SW needs.    INTERVENTION / OUTCOME / PLAN:  1. Pt completed EIP, LDP, ROI and EHIP forms.   2. Pt will send copies of paystubs of the previous 2 months. Once received, SW will submit to SWA.   3. SW available to assist with any medical CM needs that may arise.    DOCUMENT COMPOSED BY:   Argie Ramming, MSW on 11/08/2020

## 2020-11-15 ENCOUNTER — Encounter (HOSPITAL_BASED_OUTPATIENT_CLINIC_OR_DEPARTMENT_OTHER): Payer: Self-pay

## 2020-11-15 NOTE — Progress Notes (Signed)
DOCUMENT TYPE: Social Work - Follow-up Note    PATIENT: [name], [name]   MRN: [id number]  DOB: 1990/05/22    ENCOUNTER DATE:  11/15/2020    ASSOCIATED PROGRAM-CLINIC:  [facility]    CONTACT TYPE:  Email    BRIEF DESCRIPTION:  F/U pending proof of income    PATIENT DESCRIPTION, PRESENTING PROBLEM, PATIENT / FAMILY GOALS:  Pt is 30 year old African American gay male with HIV, CD4 count 412, VL undetectable, on ART meds established HIV care and case management at [facility] clinic. Pt diagnosed with HIV on 03/21/2014 previous provider Dr. Staci Righter at [place], [place]. His family does not know about his diagnosis, but some his friends know. Pt's CM/SW is [name] who is out; this SW is covering.    SW received this email from pt: "Hi Steward Drone, My apologies for the delay, I am having trouble accessing my profile to print out my statments. I am awaiting a response to reset my password. I will should have my statements to you as soon as possible.  Thank you, Dennis [name]."     SW replied to pt requesting pt to send proof of income as soon as he is able, and requested pt also include SWA, Anette Riedel, as this Clinical research associate is only in clinic a few days per week. SWA email address provided to pt as well.     SW also encouraged pt to send proof of income at least a week or longer, before he is due to pick a refill, in order to provide enough time for EIP to process his application.    INTERVENTION / OUTCOME / PLAN:  1. SW will assist with submitting EIP application, pending pt provides proof of income.   2. SW available to assist with any medical case management need that may arise.    DOCUMENT COMPOSED BY:   Dennis [name], MSW on 11/15/2020

## 2020-11-16 ENCOUNTER — Encounter (HOSPITAL_BASED_OUTPATIENT_CLINIC_OR_DEPARTMENT_OTHER): Payer: Self-pay

## 2020-11-16 NOTE — Progress Notes (Signed)
DOCUMENT TYPE: Social Work - Follow-up Note    PATIENT: Dennis [name], Dennis [name]   MRN: [id number]  DOB: 22-Oct-1990    ENCOUNTER DATE:  11/16/2020    ASSOCIATED PROGRAM-CLINIC:  [facility]    CONTACT TYPE:  Email    BRIEF DESCRIPTION:  EIP submitted    PATIENT DESCRIPTION, PRESENTING PROBLEM, PATIENT / FAMILY GOALS:  Pt is 30 year old African American gay male with HIV, CD4 count 412, VL undetectable, on ART meds established HIV care and case management at [facility] clinic. Pt diagnosed with HIV on 03/21/2014 previous provider Dr. Staci Righter at [place], [place]. His family does not know about his diagnosis, but some his friends know. Pt's CM/SW is [name] who is out; this SW is covering.    Pt emailed proof of income to this Clinical research associate and SWA, [employer].    INTERVENTION / OUTCOME / PLAN:  1. SW printed pt's proof of income and submitted EIP application to SWA, [place].   2. SW available to assist pt with any medical cm need that may arise.    DOCUMENT COMPOSED BY:   Bryn Gulling, MSW on 11/16/2020

## 2020-12-06 ENCOUNTER — Telehealth (HOSPITAL_BASED_OUTPATIENT_CLINIC_OR_DEPARTMENT_OTHER): Payer: Self-pay | Admitting: Social Worker

## 2020-12-14 NOTE — Telephone Encounter (Signed)
DOCUMENT TYPE: Social Work - Follow-up Note    PATIENT: Dennis [name], Dennis [name]   MRN: [id number]  DOB: 08/25/1990    ENCOUNTER DATE:  12/06/2020    ASSOCIATED PROGRAM-CLINIC:  [facility]    CONTACT TYPE:  Telephone    BRIEF DESCRIPTION:  Insurance enrollment pending    PATIENT DESCRIPTION, PRESENTING PROBLEM, PATIENT / FAMILY GOALS:  SW called pt to discuss insurance enrollment for 2023. Pt answered phone. SW explained by they were calling and requested pt's permission to link to him in [facility] as a Navigator so they could review his account with pt on the phone and update any information. Pt agreeable.    INTERVENTION / OUTCOME / PLAN:  1. SW linked to pt in [facility]. Pt reported being satisfied with [employer] plan stated he would like to renew this plan. SW reviewed pt account information. Pt's employment information had changed. SW initiated update of this information in [facility] account. Question came up about if pt had been offered ESI [employer]) through his employer and pt reported that it was offered however he had declined it. [employer] requested information about the lowest premium amount offered. Pt was unsure what this amount was. Unable to proceed past this question with renewal application without entering a numeric value.     2. SW and pt discussed pt following up with HR department about cost of ESI. Provided pt with SW direct contact information so he could f/u once this information was obtained. Informed pt that enrollment needed to be completed prior to 12/15 in order to avoid a lapse in coverage and have a 01/14/2021 start date. Pt reported understanding.     Plan: SW and pt to f/u and complete [facility] insurance renewal once he obtains information about the lowest cost ESI that he was eligible to enroll in.    DOCUMENT COMPOSED BY:   Docia Chuck, LICSW [phone]

## 2020-12-15 ENCOUNTER — Telehealth (HOSPITAL_BASED_OUTPATIENT_CLINIC_OR_DEPARTMENT_OTHER): Payer: Self-pay | Admitting: Social Worker

## 2020-12-19 NOTE — Telephone Encounter (Signed)
DOCUMENT TYPE: Social Work - Follow-up Note    PATIENT: Dennis [name], Dennis [name]   MRN: [id number]  DOB: 1990/11/21    ENCOUNTER DATE:  12/15/2020    ASSOCIATED PROGRAM-CLINIC:  [facility]    CONTACT TYPE:  Telephone    BRIEF DESCRIPTION:  OE reminder ph call    PATIENT DESCRIPTION, PRESENTING PROBLEM, PATIENT / FAMILY GOALS:  SW called pt to find out if he had a chance to reach out to his employer's HR department and figure out the lowest cost ESI premium that he was eligible for. Pt reported that he had not remembered to do this. Stated he would f/u and requested to call SW back.    INTERVENTION / OUTCOME / PLAN:  1. SW reviewed with pt that enrollment needed to be completed prior to 12/15 in order for pt to have a 01/14/2021 start date. Confirmed that pt could contact SW when he had this information.    DOCUMENT COMPOSED BY:   Docia Chuck, LICSW [phone]

## 2021-01-10 ENCOUNTER — Other Ambulatory Visit (HOSPITAL_BASED_OUTPATIENT_CLINIC_OR_DEPARTMENT_OTHER): Payer: Self-pay | Admitting: Infectious Disease

## 2021-01-30 ENCOUNTER — Encounter (HOSPITAL_BASED_OUTPATIENT_CLINIC_OR_DEPARTMENT_OTHER): Payer: Self-pay | Admitting: Otolaryngology

## 2021-01-30 ENCOUNTER — Ambulatory Visit: Payer: 59 | Attending: Otolaryngology | Admitting: Otolaryngology

## 2021-01-30 VITALS — BP 124/78 | HR 70 | Temp 97.7°F | Resp 20 | Ht 72.0 in | Wt 186.0 lb

## 2021-01-30 DIAGNOSIS — R070 Pain in throat: Secondary | ICD-10-CM | POA: Insufficient documentation

## 2021-01-30 DIAGNOSIS — R490 Dysphonia: Secondary | ICD-10-CM | POA: Insufficient documentation

## 2021-01-30 NOTE — Progress Notes (Signed)
Encounter Date: 01/30/2021     Chief complaint:  This patient was seen in clinic today in follow up of   Chief Complaint   Patient presents with    Throat Problem       Interval history:  31 year old male returns with dysphonia and throat discomfort.  PMH of well-controlled HIV, MJ use, anxiety.  Last visit was on  10/14.  Patient reports getting over a medium cold 1 month ago, with resolving sinus congestion and residual cough.  He tried nasal saline irrigation initially.  He has made some dietary changes for silent reflux.  Reports voice has been about the same, being deeper since the summer, with associated side of throat minimal/mild soreness/tension.  He would like to continue watchful waiting, defers referral to speech therapy.  No swallow or breathing issues.      Teaches dance class 1x/wkly, and needs to talk over music.  Using voice regular amount    Denies fever, chills, nausea, vomit, cough, sore throat, otalgia, hemoptysis, unintentional weight loss.      Questionnaire Scores:    Voice handicap index: 11/40  Reflux symptom index: 12/45  Eating assessment tool: 0/40        Past Medical History:  No past medical history on file.    Medications:  Outpatient Medications Prior to Visit   Medication Sig Dispense Refill    dolutegravir 50 MG tablet Take 1 tablet (50 mg) by mouth daily. 30 tablet 5    DULoxetine 60 MG DR capsule Take 1 capsule (60 mg) by mouth daily. 30 capsule 5    emtricitabine-tenofovir alafenamide (Descovy) 200-25 MG tablet Take 1 tablet by mouth daily. 30 tablet 5     No facility-administered medications prior to visit.       Allergies:  Patient has no known allergies.    Social History:  Social History     Socioeconomic History    Marital status: Single   Tobacco Use    Smoking status: Former   Substance and Sexual Activity    Alcohol use: Yes     Comment: 3-4 drinks/wk    Drug use: Yes     Types: Marijuana     Comment: occ       A complete review of systems was performed and was  positive for items noted in the HPI above with all other systems negative.    Physical examination:  Vital signs: BP 124/78    Pulse 70    Temp 36.5 C (Temporal)    Resp 20    Ht 6' (1.829 m)    Wt 84.4 kg (186 lb)    SpO2 98%    BMI 25.23 kg/m   General: No apparent distress        voice: Slightly deep, constant  ENT: TMs and EACs wnl b/l.  Anterior rhinoscopy demonstrates generally healthy with bilateral mucus drainage but mildly congested turbinate and septal mucosa. No focal lesions are seen.   No frontal or maxillary sinus TTP  Oral cavity and oropharyngeal examinations identify  no obvious mucosal lesions.  Neck: No palpable lymphadenopathy on examination today. Landmarks palpable and in the midline.  CN V and VII grossly intact      Assessment and plan:  In summary, 31 year old with PMH of well-controlled HIV, anxiety, returns with    (R49.0) Dysphonia  (primary encounter diagnosis), associated with some muscle tension seen on stroboscopy on last visit 10/14  Plan: Patient defers referral to SLP,  he would like to continue watchful waiting, as his voice has been unchanged.  He is recovering from moderate cold 1 month ago with dry cough    Patient Recommendations:  Continue with LPR precautions  Resume nasal saline irrigation for chronic rhinitis or saline sprays   Consider adding microphone to avoid straining voice during weekly dance lessons  Discussed behavioral cough therapy to  minimize VF irritation ex- Cough with resistance    Follow up:      Return as needed or 3-4 mo, ear wax cleaning    The patient was seen and evaluated with Dr. Glendon Axe.    I appreciate the opportunity to be involved in the care of this patient. Please feel free to contact me with any questions or concerns.  Wendelyn Breslow, PA-C    Please note, this report was generated in part with the aid of voice recognition software. Please call us with questions.

## 2021-02-12 ENCOUNTER — Ambulatory Visit: Payer: 59 | Attending: Infectious Disease | Admitting: Infectious Disease

## 2021-02-12 ENCOUNTER — Other Ambulatory Visit (HOSPITAL_BASED_OUTPATIENT_CLINIC_OR_DEPARTMENT_OTHER): Payer: Self-pay | Admitting: Infectious Disease

## 2021-02-12 VITALS — BP 126/75 | HR 58 | Temp 98.8°F | Resp 16 | Ht 71.0 in | Wt 186.4 lb

## 2021-02-12 DIAGNOSIS — B20 Human immunodeficiency virus [HIV] disease: Secondary | ICD-10-CM

## 2021-02-12 DIAGNOSIS — F4322 Adjustment disorder with anxiety: Secondary | ICD-10-CM | POA: Insufficient documentation

## 2021-02-12 MED ORDER — DULOXETINE HCL 60 MG OR CPEP
60.0000 mg | DELAYED_RELEASE_CAPSULE | Freq: Every day | ORAL | 5 refills | Status: DC
Start: 2021-02-12 — End: 2021-04-16

## 2021-02-12 MED ORDER — DOLUTEGRAVIR SODIUM 50 MG OR TABS
50.0000 mg | ORAL_TABLET | Freq: Every day | ORAL | 5 refills | Status: DC
Start: 2021-02-12 — End: 2021-04-16

## 2021-02-12 MED ORDER — DESCOVY 200-25 MG OR TABS
1.0000 | ORAL_TABLET | Freq: Every day | ORAL | 5 refills | Status: DC
Start: 2021-02-12 — End: 2021-04-16

## 2021-02-12 NOTE — Progress Notes (Signed)
Dennis [name]  FOLLOW-UP VISIT  02/12/2021    ID/CC: Dennis [name] is a 31 year old man with well-controlled HIV here for routine care visit.    ASSESSMENT/PLAN:   1. HIV disease (HCC): Excellent reported adherence to DTG + F/TAF, last CD4 >500 in 10/2020. Has standing orders for q47mo monitoring per his request.  - GC & CT Nucleic Acid Detection - Urine; Standing  - GC & CT Nucleic Acid Detection - Rectal; Standing  - GC & CT Nucleic Acid Detection - Throat; Standing  - HIV1 Rna Quantitation; Standing  - Hepatitis C Antibody w/Reflex PCR; Future  - dolutegravir 50 MG tablet; Take 1 tablet (50 mg) by mouth daily.  Dispense: 30 tablet; Refill: 5  - emtricitabine-tenofovir alafenamide (Descovy) 200-25 MG tablet; Take 1 tablet by mouth daily.  Dispense: 30 tablet; Refill: 5    2. Adjustment disorder with anxiety: Euthymic with improved subjective anxiety. No likely benefit of increasing dose but could cause side effects, so will continue at same dosage for now. He continues engaging with virtual therapist.  - DULoxetine 60 MG DR capsule; Take 1 capsule (60 mg) by mouth daily.  Dispense: 30 capsule; Refill: 5    Return to [name]/follow-up: 6 months    I spent a total of 29 minutes for the patient's care on the date of the service.        _____________________________  HISTORY OF PRESENT ILLNESS:   - [name] reports the duloxetine is helping him to feel less emotional and be more active. Denies any side effects. Ok with staying at current dose.   - Prior virtual therapist left and now has a new male provider   - Got a promotion at work and now has a team working under him. Not as concerned about losing his job, though company is still doing layoffs  - Taking ART as directed, no missed doses or side effects.   - Declines tetanus booster today   - Shares he is taking ashwagandha and a B12/folate supplement and brings these today for me to review    REVIEW OF SYSTEMS:   I performed a complete review of systems today and  have documented the patient's positive and pertinent negative complaints above. All remaining systems are negative.     PHYSICAL EXAMINATION:   Vital Signs: BP 126/75    Pulse (!) 58    Temp 37.1 C (Temporal)    Resp 16    Ht 5\' 11"  (1.803 m) Comment: pt stated   Wt 84.6 kg (186 lb 6.4 oz)    SpO2 98%    BMI 26.00 kg/m     Constitutional:Well-appearing, well-groomed, in no distress  Eyes: anicteric, EOMI and conjunctivae clear  Lungs:Unlabored breathing  Heart:Normal rate,no edema  Neuro: A&Ox3, normal motor function, normal gait  Psych: Calm and not depressed, congruent affect, good insight    Family History:  No known medical problems in family including HTN, DM, cancers    I personally reviewed and verified the documented history with the patient andagree with it.    HIV General Healthcare Maintenance  Cardiovascular/metabolic   Lipids: nl in 07/2017, 10/2017  BP: nl screens at last4visits  Glucose control: A1c nl 07/2017  UA: microhematuria in 07/2017-->resolved on repeat UA 10/2017. Trace proteinuria 01/2019. Normal exam 08/2019    Sexual health and STI screening  Syphilis: serofast RPR 1:4 in 02/2017 after tx for early syphilis in 01/2016 (initial RPR 1:32) --> 1:1 in 10/2017 (likely new serofast titer) -->  1:1in3/2020, 08/2018, 01/2019, 08/2019 --> 1:4 in 01/2020 (treated for EL syphilis with BIC x1)--> back down to serofast titer 1:1 in 04/2020  GC/CT: neg screen x3 in10/2019, 08/2018, 01/2020, 05/2020 (then throat neg 06/2020 - tested at time of sore throat). Urine neg 01/2021  Sexual pleasure: discuss at future visit  HPV status: declined vaccine series  HSV status: no known outbreaks    Cancer screening  Lung: occ MJ smoke, need to clarify tobacco use  Anal: address as needed at age 20    Immunizations/Serologies and misc testing  Previously declinedflu, COVID bivalent booster, Menveo, TDaP and HPV vaccines  Quantiferon neg 01/2016  Toxo neg 01/2016  HAV immune 01/2016  HBsAb pos (titer 82), sAg neg, cAb  neg 01/2016  HCV NR 02/2017, 04/2020    Immunization History   Administered Date(s) Administered    COVID-19 Pfizer mRNA 12 yrs and older (purple cap) 01/28/2019, 02/17/2019    COVID-19 Pfizer mRNA tris-sucrose 12 yrs and older (gray cap) 06/21/2020    Influenza quadrivalent PF 02/08/2016, 10/10/2016    Mpox/smallpox (vaccinia) live 09/03/2020, 10/07/2020    Pneumococcal conjugate PCV13 (Prevnar 13) 05/02/2016    Pneumococcal polysaccharide PPSV23 (Pneumovax 23) 10/10/2016

## 2021-02-13 ENCOUNTER — Encounter (HOSPITAL_BASED_OUTPATIENT_CLINIC_OR_DEPARTMENT_OTHER): Payer: Self-pay | Admitting: Counselor

## 2021-02-13 LAB — GC&CHLAM NUCLEIC ACID DETECTN
Chlam Trachomatis Nucleic Acid: NEGATIVE
N.Gonorrhoeae(GC) Nucleic Acid: NEGATIVE

## 2021-02-13 NOTE — Progress Notes (Signed)
DOCUMENT TYPE:    Social Work - Follow-up Note    PATIENT:    Dennis [name], Dennis [name]  MRN:    [id number]  DOB:    1990/10/08    ENCOUNTER DATE:  02/13/2021    ASSOCIATED PROGRAM-CLINIC:  [facility]    CONTACT TYPE:  Face to Face - Office    BRIEF DESCRIPTION:  Service plan and check-in    PATIENT DESCRIPTION, PRESENTING PROBLEM, PATIENT / FAMILY GOALS:  Pt is 31 year old African American gay male with HIV, CD4 count 412, VL undetectable, on ART meds established HIV care and case management at [facility] clinic. Pt diagnosed with HIV on 03/21/2014 previous provider Dr. Scharlene Gloss at [place], [place]. His family does not know about his diagnosis, but some his friends know. Pt's CM/SW is Nicholes Calamity who is out; this SW is covering.    Service plan and check-in    INTERVENTION / OUTCOME / PLAN:  SW met with pt after his appointment. Pt reports coming to all doctor's appointments. Pt reports daily adherence to medications except for the past weekend when his meds were delivered late. Pt understands the importance of taking his medications daily. Pt has a dentist in [place]; he has not been to the dentist in about a year but agrees to contact his previous dentist and set up an appointment. Pt has a hx of depression/anxiety; he has been seeing a therapist and taking medications to address his symptoms. Pt reports social etoh use but denies drug use. Pt has a new insurance provided by his employer-[employer]. Copy of pt's new insurance card added to chart. Pt remains employed, housed and independent with all ADLs. Pt denies legal issues. Pt is in a relationship with HIV+ partner; pt does not use condoms as he is in a monogamous relationship. Pt denies SW needs at this time.    Plan  Service plan updated.  SW will remain available as needed.    DOCUMENT COMPOSED BY:   Denton Ar, MSW [phone] on  02/13/2021

## 2021-02-14 LAB — HIV1 RNA QUANTITATION: HIV RNA Result: NOT DETECTED {copies}/mL

## 2021-02-19 LAB — RPR QUANTITATIVE (SENDOUT): RPR Quantitative (Sendout): NONREACTIVE

## 2021-03-29 ENCOUNTER — Other Ambulatory Visit (HOSPITAL_BASED_OUTPATIENT_CLINIC_OR_DEPARTMENT_OTHER): Payer: Self-pay | Admitting: Unknown Physician Specialty

## 2021-03-29 ENCOUNTER — Ambulatory Visit: Payer: 59 | Attending: Infectious Disease | Admitting: Unknown Physician Specialty

## 2021-03-29 ENCOUNTER — Other Ambulatory Visit (HOSPITAL_BASED_OUTPATIENT_CLINIC_OR_DEPARTMENT_OTHER): Payer: Self-pay | Admitting: Infectious Disease

## 2021-03-29 ENCOUNTER — Encounter (HOSPITAL_BASED_OUTPATIENT_CLINIC_OR_DEPARTMENT_OTHER): Payer: Self-pay | Admitting: Social Worker

## 2021-03-29 VITALS — HR 77 | Temp 97.5°F | Resp 16

## 2021-03-29 DIAGNOSIS — B20 Human immunodeficiency virus [HIV] disease: Secondary | ICD-10-CM

## 2021-03-29 DIAGNOSIS — R369 Urethral discharge, unspecified: Secondary | ICD-10-CM | POA: Insufficient documentation

## 2021-03-29 MED ORDER — CEFTRIAXONE SODIUM 500 MG IJ SOLR
INTRAMUSCULAR | Status: AC
Start: 2021-03-29 — End: 2021-03-29
  Filled 2021-03-29: qty 500

## 2021-03-29 MED ORDER — CEFTRIAXONE SODIUM 500 MG IJ SOLR
500.0000 mg | Freq: Once | INTRAMUSCULAR | Status: AC
Start: 2021-03-29 — End: 2021-03-29
  Administered 2021-03-29: 500 mg via INTRAMUSCULAR

## 2021-03-29 NOTE — Progress Notes (Signed)
[facility] 03/29/2021   STI TESTING    Dennis [name] presents to nursing for STI testing. Reviewed name, DOB, and allergies.   Patient confirmed name and date of birth.    CHART REVIEW  Patient is HIV positive: YES  Patient has history of syphilis: YES  Diagnosis code "STD Exposure" entered (if HIV negative): NO  Diagnosis code "HIV Disease" entered (if HIV positive): YES    SUBJECTIVE  Patient is here for routine testing: YES  Patient reports a known exposure: no  Patient reports the following symptoms of STI: white, yellow, or green discharge   Pt reports having clear penile discharge x 3-4 days. Last sexual encounter 3 weeks ago, no protection used. Pt reports engaging only in reciprocal oral sex.  Also reports having a rash on right elbow about 3 cm in diameter, no erythema observed, no itching, pt reports reading about it and it could be eczema. States has applied lotion to site.      ASSESSMENT  Patient is appropriate for: self testing and treatment of gonorrhea  Patient is appropriate for SURRG testing: NO  STI testing orders placed under Dr Oneal Grout: NO    PLAN/TREATMENT  Patient provided urine specimen, and self-collected rectal and pharyngeal swabs.  Pt consented to [facility] testing. Swabs for throat and rectum collected by pt, pt left urine for SURRG.  3 Specimens for SURRG testing.     Orders submitted this visit:  Orders Placed This Encounter   . RPR QUANTITATIVE   . cefTRIAXone (Rocephin) injection 500 mg   This nurse consulted with attending, Dr. Maudie Mercury. Order entered for ceftriaxone.      Blood draw obtained by this RN without complication RPR quant tube collected from RAC x 1 attempt with 23G needle.   Neurosyphilis screen: NO    Medication Administrations This Visit       cefTRIAXone (Rocephin) injection 500 mg Admin Date  03/29/2021  11:10 Action  Given Dose  500 mg Route  Intramuscular Site  Left Deltoid Administered By  Hollice Gong, RN    Ordering Provider: Lelon Mast, MD     NDC: [phone]    Lot#: [id number]         Patient was encouraged to call clinic or check MyChart for results.    RECOMMENDATIONS  . Patient was encouraged to return in 3 months or sooner if needed for follow-up testing: NO, pt aware of OV with PCP on 04/16/21.   Marland Kitchen Patient advised to notify any sexual partners of exposure, or contact [facility] at [phone] for anonymous notification: yes  . Pt educated not to have sex for the next 7 days: yes    Hollice Gong, RN 03/29/2021

## 2021-03-30 LAB — GC&CHLAM NUCLEIC ACID DETECTN
Chlam Trachomatis Nucleic Acid: NEGATIVE
Chlam Trachomatis Nucleic Acid: NEGATIVE
Chlam Trachomatis Nucleic Acid: NEGATIVE
N.Gonorrhoeae(GC) Nucleic Acid: NEGATIVE
N.Gonorrhoeae(GC) Nucleic Acid: NEGATIVE
N.Gonorrhoeae(GC) Nucleic Acid: NEGATIVE

## 2021-03-30 NOTE — Progress Notes (Signed)
DOCUMENT TYPE:    Social Work - Follow-up Note    PATIENT:    Dennis [name], Dennis [name]  MRN:    [id number]  DOB:    Dec 01, 1990    ENCOUNTER DATE:  03/29/2021    ASSOCIATED PROGRAM-CLINIC:  [place]    CONTACT TYPE:  Face to Face - Office    BRIEF DESCRIPTION:  Medical Bills; ESI and QHP    PATIENT DESCRIPTION, PRESENTING PROBLEM, PATIENT / FAMILY GOALS:  Mr. Blaisdell is in clinic for a drop-in nursing visit, paging SW for assistance. A review of the Pt's medical record indicates no coverage on file and outstanding balance of $402. 62. [facility] indicates the pt. is enrolled in a QHP and EIP, so balances should be eligible for billing to primary and secondary insurance.    The Pt's assigned CM is not available in Clinic, this MSW is providing coverage.    INTERVENTION / OUTCOME / PLAN:  1. This MSW contacted the Pt. in the [facility] lobby where he reported he has received calls from [facility] regarding outstanding bills. This MSW explained it appears coverage has not been updated in his medical record. Once updated bills should be fully covered.    2. The Pt. provided a copy of his insurance card, however it is Monia Pouch, which is different from coverage on file. According to [employer] [facility] and [employer] the Pt. has an active [employer]. It appears the Pt. has enrolled in an employer sponsored insurance plan. The Pt's [employer] card was copied and will be forwarded to his assigned case manager for f/u to clarify which insurance should be billed.    Plan: inform assigned CM of outstanding balance and dual coverage to facilitate bill resolution.    DOCUMENT COMPOSED BY:   Frazier Butt, MSW [phone] on  03/30/2021

## 2021-04-04 LAB — RPR QUANTITATIVE (SENDOUT): RPR Quantitative (Sendout): NONREACTIVE

## 2021-04-16 ENCOUNTER — Ambulatory Visit: Payer: PRIVATE HEALTH INSURANCE | Attending: Infectious Disease | Admitting: Infectious Disease

## 2021-04-16 VITALS — BP 112/73 | HR 67 | Temp 98.2°F | Resp 14 | Wt 185.0 lb

## 2021-04-16 DIAGNOSIS — Z709 Sex counseling, unspecified: Secondary | ICD-10-CM | POA: Insufficient documentation

## 2021-04-16 DIAGNOSIS — F4322 Adjustment disorder with anxiety: Secondary | ICD-10-CM | POA: Insufficient documentation

## 2021-04-16 DIAGNOSIS — B20 Human immunodeficiency virus [HIV] disease: Secondary | ICD-10-CM | POA: Insufficient documentation

## 2021-04-16 MED ORDER — DULOXETINE HCL 60 MG OR CPEP
60.0000 mg | DELAYED_RELEASE_CAPSULE | Freq: Every day | ORAL | 5 refills | Status: DC
Start: 2021-04-16 — End: 2021-07-31

## 2021-04-16 MED ORDER — DESCOVY 200-25 MG OR TABS
1.0000 | ORAL_TABLET | Freq: Every day | ORAL | 5 refills | Status: DC
Start: 2021-04-16 — End: 2021-04-16

## 2021-04-16 MED ORDER — DOLUTEGRAVIR SODIUM 50 MG OR TABS
50.0000 mg | ORAL_TABLET | Freq: Every day | ORAL | 5 refills | Status: DC
Start: 2021-04-16 — End: 2021-07-31

## 2021-04-16 MED ORDER — DOLUTEGRAVIR SODIUM 50 MG OR TABS
50.0000 mg | ORAL_TABLET | Freq: Every day | ORAL | 5 refills | Status: DC
Start: 2021-04-16 — End: 2021-04-16

## 2021-04-16 MED ORDER — DESCOVY 200-25 MG OR TABS
1.0000 | ORAL_TABLET | Freq: Every day | ORAL | 5 refills | Status: DC
Start: 2021-04-16 — End: 2021-07-31

## 2021-04-16 MED ORDER — DULOXETINE HCL 60 MG OR CPEP
60.0000 mg | DELAYED_RELEASE_CAPSULE | Freq: Every day | ORAL | 5 refills | Status: DC
Start: 2021-04-16 — End: 2021-04-16

## 2021-04-16 NOTE — Progress Notes (Unsigned)
Dennis [name]  FOLLOW-UP VISIT  04/16/2021    ID/CC: Dennis [name] is a 31 year old man with HIV here for routine care visit.    ASSESSMENT/PLAN:   1. HIV disease (HCC): Historically excellent adherence to DTG + F/TAF, last CD4 >500 in 10/2020. Meds sent to new pharmacy and has standing orders for q59mo monitoring per his request.  - dolutegravir 50 MG tablet; Take 1 tablet (50 mg) by mouth daily.  Dispense: 30 tablet; Refill: 5  - emtricitabine-tenofovir alafenamide (Descovy) 200-25 MG tablet; Take 1 tablet by mouth daily.  Dispense: 30 tablet; Refill: 5    2. Adjustment disorder with anxiety: Doing very well with multimodal treatment approach. Encouraged his efforts and am happy to see how much progress he's made.  - DULoxetine 60 MG DR capsule; Take 1 capsule (60 mg) by mouth daily.  Dispense: 30 capsule; Refill: 5    3. Sex counseling: Discussed that transient pelvic pain after RAI is often expected. No red flag features and less concern as sx aren't prolonged.     Return to [name]/follow-up: 6 months or sooner as desired    I spent a total of 39 minutes for the patient's care on the date of the service.      _____________________________  HISTORY OF PRESENT ILLNESS:   -Work remains busy. Plans to speak frankly with colleagues and superiors at upcoming peer performance reviews   -Clarifies that he only uses occasional marijuana and has never used tobacco products  -Things were going okay with a new male therapist although [name] feels he was not always getting substantial benefit from their interactions.  He is learning to recognize when talking with the therapist might be most helpful and when he can manage things on his own  -Continues on the duloxetine 60 mg a day and not having any side effects.  Feels okay with continuing on this for now  -Starting to date again and has primarily 1 sex partner, sometimes another.  Notes that the new person he is seeing was uneducated about HIV, so [name] took the  opportunity to talk about U=U after disclosing his status.  This went well and the partner was receptive and open to the conversation  -Mentions that at times after receptive anal sex he has dull pelvic aching with radiation into the scrotum lasting about 1 hr and wonders if this is abnormal or not.  There was no associated tenesmus, bleeding, rectal pain. Denies fisting, sounding, using other toys, rough or physical sex. This occurred despite being with same partner from long ago. He came in for STI testing around March 16 due to having some clear rectal discharge and was worried about an STI but all testing was negative.  Symptoms have since resolved.    REVIEW OF SYSTEMS:   I performed a complete review of systems today and have documented the patient's positive and pertinent negative complaints above. All remaining systems are negative.     PHYSICAL EXAMINATION:   Vital Signs: BP 112/73   Pulse 67   Temp 36.8 C (Temporal)   Resp 14   Wt 83.9 kg (185 lb)   SpO2 96%   BMI 25.80 kg/m     Constitutional:Well-appearing, well-groomed, in no distress  Eyes: anicteric, EOMI and conjunctivae clear  Lungs:Unlabored breathing  Heart:Normal rate,no edema  Neuro: A&Ox3, normal motor function, normal gait  Psych: Calmand not depressed,congruent affect, good insight    HIV HISTORY:  HIV MGQ:QPYPPJ [name] --> Dennis [name]  Case  Manager:[name] Duressa  Diagnosis Date:03/2014 in [place]  Risk Factors for HIV:MSM  CD4 Nadir:Unk, CD4 404 in 05/2016  ARV/Resistance History:Unk resistance. Started Genvoya at dx --> DTG + FTC/TAC in 06/2016  HIV-Related Illnesses:None known  Viral Hepatitis:HAV immune 01/2016; HBsAb pos, sAg neg, cAb neg 01/2016; HCV NR 02/2017    HIV General Healthcare Maintenance  Cardiovascular/metabolic   Lipids: nl in 07/2017, 10/2017  BP: nl screens at last4visits  Glucose control: A1c nl 07/2017  UA: microhematuria in 07/2017-->resolved on repeat UA 10/2017. Trace proteinuria 01/2019. Normal exam  08/2019    Sexual health and STI screening  Syphilis: serofast RPR 1:4 in 02/2017 after tx for early syphilis in 01/2016 (initial RPR 1:32) --> 1:1 in 10/2017 (likely new serofast titer) --> 1:1in3/2020, 08/2018, 01/2019, 08/2019 --> 1:4 in 01/2020 (treated for EL syphilis with BIC x1)--> back down to serofast titer 1:1 in 04/2020 --> NR in 01/2021, 03/2021  GC/CT: neg screen x3 in10/2019, 08/2018, 01/2020, 05/2020(then throat neg 06/2020 - tested at time of sore throat). Last screen neg x3 sites in 03/2021  Sexual pleasure: MSM, vers. Able to derive pleasure from sex, although sex has never been the focus of his relationships. No ED or libido changes associated with duloxetine use.   HPV status: declined series  HSV status: no known outbreaks    Cancer screening  Lung: N/A (never tobacco user)  Anal: address as needed at age 78    Immunizations/Serologies and misc testing  Declinesflu, COVID bivalent booster, Menveo, TDaP and HPV vaccines  Quantiferon neg 01/2016  Toxo neg 01/2016  HAV immune 01/2016  HBsAb pos(titer 82), sAg neg, cAb neg 01/2016  HCV NR 02/2017, 04/2020    Immunization History   Administered Date(s) Administered   . COVID-19 Pfizer mRNA 12 yrs and older (purple cap) 01/28/2019, 02/17/2019   . COVID-19 Pfizer mRNA tris-sucrose 12 yrs and older (gray cap) 06/21/2020   . Influenza quadrivalent PF 02/08/2016, 10/10/2016   . Mpox/smallpox (vaccinia) live 09/03/2020, 10/07/2020   . Pneumococcal conjugate PCV13 (Prevnar 13) 05/02/2016   . Pneumococcal polysaccharide PPSV23 (Pneumovax 23) 10/10/2016

## 2021-04-17 ENCOUNTER — Telehealth (HOSPITAL_BASED_OUTPATIENT_CLINIC_OR_DEPARTMENT_OTHER): Payer: Self-pay | Admitting: Otolaryngology

## 2021-04-17 NOTE — Telephone Encounter (Signed)
Spoke with patient to reschedule appt with Dr.Sardesai due to provider being out on 4/17. Patient states he no longer needs follow up appt for his throat, but wants to keep upcoming ear cleaning appt only. Appt cancelled on 4/17.

## 2021-04-30 ENCOUNTER — Encounter (HOSPITAL_BASED_OUTPATIENT_CLINIC_OR_DEPARTMENT_OTHER): Payer: PRIVATE HEALTH INSURANCE | Admitting: Otolaryngology

## 2021-05-03 ENCOUNTER — Ambulatory Visit (HOSPITAL_BASED_OUTPATIENT_CLINIC_OR_DEPARTMENT_OTHER): Payer: PRIVATE HEALTH INSURANCE | Admitting: Family

## 2021-06-27 ENCOUNTER — Other Ambulatory Visit (HOSPITAL_BASED_OUTPATIENT_CLINIC_OR_DEPARTMENT_OTHER): Payer: Self-pay | Admitting: Infectious Disease

## 2021-07-23 ENCOUNTER — Ambulatory Visit (HOSPITAL_BASED_OUTPATIENT_CLINIC_OR_DEPARTMENT_OTHER): Payer: PRIVATE HEALTH INSURANCE | Admitting: Infectious Disease

## 2021-07-23 ENCOUNTER — Telehealth (HOSPITAL_BASED_OUTPATIENT_CLINIC_OR_DEPARTMENT_OTHER): Payer: Self-pay | Admitting: Infectious Disease

## 2021-07-23 NOTE — Progress Notes (Deleted)
[facility]  FOLLOW-UP VISIT  07/23/2021    ID/CC: Dennis [name] is a 31 year old male who returns ***    ASSESSMENT/PLAN:   There are no diagnoses linked to this encounter.     Return to clinic/follow-up: ***  _____________________________  HISTORY OF PRESENT ILLNESS:   ***       PHYSICAL EXAMINATION:   Vital Signs: There were no vitals taken for this visit.  {comprehensive exam:110854}    {History SmartLinks - complete If you wish to pull any of the patient's Epic history into the note (Optional):111132}    HIV General Healthcare Maintenance    Cardiovascular/metabolic   Lipids: nl in 07/2017, 10/2017  BP: nl screens at last 4 visits  Glucose control: A1c nl 07/2017  UA: microhematuria in 07/2017 --> resolved on repeat UA 10/2017. Trace proteinuria 01/2019. Normal exam 08/2019     Sexual health and STI screening  Syphilis: serofast RPR 1:4 in 02/2017 after tx for early syphilis in 01/2016 (initial RPR 1:32) --> 1:1 in 10/2017 (likely new serofast titer) --> 1:1 in 03/2018, 08/2018, 01/2019, 08/2019 --> 1:4 in 01/2020 (treated for EL syphilis with BIC x1) --> back down to serofast titer 1:1 in 04/2020 --> NR in 01/2021, 03/2021  GC/CT: neg screen x3 in 10/2017, 08/2018, 01/2020, 05/2020 (then throat neg 06/2020 - tested at time of sore throat). Last screen neg x3 sites in 03/2021  Sexual pleasure: MSM, vers. Able to derive pleasure from sex, although sex has never been the focus of his relationships. No ED or libido changes associated with duloxetine use.   HPV status: declined series  HSV status: no known outbreaks    Cancer screening  Lung: N/A (never tobacco user)  Anal: address as needed at age 62     Immunizations/Serologies and misc testing  Declines flu, COVID bivalent booster, Menveo, TDaP and HPV vaccines   Quantiferon neg 01/2016  Toxo neg 01/2016  HAV immune 01/2016  HBsAb pos (titer 82), sAg neg, cAb neg 01/2016  HCV NR 02/2017, 04/2020    Immunization History   Administered Date(s) Administered    COVID-19 Pfizer mRNA  monovalent 12 yrs and older (purple cap) 01/28/2019, 02/17/2019    COVID-19 Pfizer mRNA monovalent tris-sucrose 12 yrs and older (gray cap) 06/21/2020    Influenza quadrivalent PF 02/08/2016, 10/10/2016    Mpox/smallpox (vaccinia) live 09/03/2020, 10/07/2020    Pneumococcal conjugate PCV13 (Prevnar 13) 05/02/2016    Pneumococcal polysaccharide PPSV23 (Pneumovax 23) 10/10/2016

## 2021-07-23 NOTE — Telephone Encounter (Signed)
Placed outgoing call to [facility] to inquire as to concern. He shares that he has a rash on his elbow that he has mentioned to Dr. Lady Gary before, he feels as though it is spreading. He shares it occasionally itches, but the itch comes and goes. He shares it is still localized to his elbow. He shares at this previous visit, it was determined to have been dry skin. He is wondering if he could get an allergy test or referral to allergy clinic. Advised patient of what diagnoses [facility] allergy clinic sees, shared that we may have increased availability in the fall, and which other allergy clinics are in the area (namely [place] and one on the [place]). Shared with [name] that this can all be discussed at his visit with Dr. Lady Gary next week as to whether what is happening is allergic in nature and what best options are. [name] verbalized understanding.   -Sheamus Hasting T, RN

## 2021-07-23 NOTE — Telephone Encounter (Signed)
RETURN CALL: Voicemail - Not Available      SUBJECT:  General Message     MESSAGE: Patient would like to speak with care team regarding his previous visit.

## 2021-07-30 ENCOUNTER — Ambulatory Visit: Payer: 59 | Attending: Infectious Disease | Admitting: Infectious Disease

## 2021-07-30 ENCOUNTER — Encounter (HOSPITAL_BASED_OUTPATIENT_CLINIC_OR_DEPARTMENT_OTHER): Payer: Self-pay | Admitting: Infectious Disease

## 2021-07-30 ENCOUNTER — Other Ambulatory Visit (HOSPITAL_BASED_OUTPATIENT_CLINIC_OR_DEPARTMENT_OTHER): Payer: Self-pay | Admitting: Infectious Disease

## 2021-07-30 VITALS — BP 118/73 | HR 61 | Temp 96.4°F | Ht 71.0 in | Wt 184.0 lb

## 2021-07-30 DIAGNOSIS — D708 Other neutropenia: Secondary | ICD-10-CM

## 2021-07-30 DIAGNOSIS — B20 Human immunodeficiency virus [HIV] disease: Secondary | ICD-10-CM | POA: Insufficient documentation

## 2021-07-30 DIAGNOSIS — F4322 Adjustment disorder with anxiety: Secondary | ICD-10-CM | POA: Insufficient documentation

## 2021-07-30 DIAGNOSIS — L239 Allergic contact dermatitis, unspecified cause: Secondary | ICD-10-CM | POA: Insufficient documentation

## 2021-07-30 LAB — CBC, DIFF
% Basophils: 0 %
% Eosinophils: 2 %
% Immature Granulocytes: 0 %
% Lymphocytes: 71 %
% Monocytes: 10 %
% Neutrophils: 17 %
% Nucleated RBC: 0 %
Absolute Eosinophil Count: 0.05 10*3/uL (ref 0.00–0.50)
Absolute Lymphocyte Count: 2.34 10*3/uL (ref 1.00–4.80)
Basophils: 0.01 10*3/uL (ref 0.00–0.20)
Hematocrit: 37 % — ABNORMAL LOW (ref 38.0–50.0)
Hemoglobin: 13.3 g/dL (ref 13.0–18.0)
Immature Granulocytes: 0 10*3/uL (ref 0.00–0.05)
MCH: 30.1 pg (ref 27.3–33.6)
MCHC: 35.7 g/dL (ref 32.2–36.5)
MCV: 84 fL (ref 81–98)
Monocytes: 0.31 10*3/uL (ref 0.00–0.80)
Neutrophils: 0.57 10*3/uL — ABNORMAL LOW (ref 1.80–7.00)
Nucleated RBC: 0 10*3/uL
Platelet Count: 211 10*3/uL (ref 150–400)
RBC: 4.42 10*6/uL (ref 4.40–5.60)
RDW-CV: 13.6 % (ref 11.0–14.5)
WBC: 3.28 10*3/uL — ABNORMAL LOW (ref 4.3–10.0)

## 2021-07-30 NOTE — Progress Notes (Signed)
[facility]  FOLLOW-UP VISIT  07/30/2021    ID/CC: Dennis [name] is a 31 year old man with HIV here to discuss elbow rash.    ASSESSMENT/PLAN:   1. HIV disease (HCC): Longstanding excellent adherence to DTG + F/TAF, last CD4 >500 in 10/2020. Will get labs today. Has standing orders for q37mo monitoring per his request.    - RPR Quantitative (Sendout); Standing  - GC & CT Nucleic Acid Detection - Throat; Standing    2. 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 Neutropenia is listed as a potential AE for INSTIs, including DTG, but switch of ART did not result in significant change. I'm not inclined to change his ART since he is virally suppressed. Pt is asymptomatic and now experiencing any sequela of this. Will e-consult with hematology for their thoughts.   - CBC with Diff; Future    3. Allergic contact dermatitis, unspecified trigger: Faint papular rash of unclear etiology. Itchiness suggests a component of histamine - ? allergic contact. Doesn't look typical for psoriasis, keratosis pilaris, eczema. May be related to food trigger but rash is very localized to one spot, which makes me think it could be a more targeted exposure. He will continue to think about possible exposures. Ok to use topical steroid cream prn itching. Send picture of rash if it flares or worsens and we can send to [facility] for e-consult.     Return to clinic/follow-up: 6 months or sooner prn    I spent a total of 32 minutes for the patient's care on the date of the service.        _____________________________  HISTORY OF PRESENT ILLNESS:   - Doing well overall. 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 Got STI testing at [facility] and presumes all was negative since he didn't hear otherwise  - Has been working out more  - Notes R elbow rash he had previously has persisted, is often  itchy when it flares, not uncomfortable per se. Seems like it's now spreading concentrically. Denies any known exposures or toxin contact. Uses an arm chair at work but most often on L arm/elbow. Wonders if related to foods as he's been trying different types of foods along with workouts. Similar rash has never happened before.       Current Outpatient Medications:     dolutegravir 50 MG tablet, Take 1 tablet (50 mg) by mouth daily., Disp: 30 tablet, Rfl: 5    DULoxetine 60 MG DR capsule, Take 1 capsule (60 mg) by mouth daily., Disp: 30 capsule, Rfl: 5    emtricitabine-tenofovir alafenamide (Descovy) 200-25 MG tablet, Take 1 tablet by mouth daily., Disp: 30 tablet, Rfl: 5      PHYSICAL EXAMINATION:   Vital Signs: BP 118/73   Pulse 61   Temp (!) 35.8 C (Temporal)   Ht 5\' 11"  (1.803 m)   Wt 83.5 kg (184 lb)   BMI 25.66 kg/m   Constitutional: Well-appearing, well-groomed, in no distress  Skin: Faint skin-colored papular eruption over olecranon with one assoc. hyperpigmented circular lesion, no erythema or weeping, no hives, nontender to touch, no LAD  Neuro: A&Ox3, normal motor function, normal gait  Psych: Calm and not depressed, congruent affect, good insight    HIV General Healthcare Maintenance  Cardiovascular/metabolic   Lipids: nl in 07/2017, 10/2017  BP: nl screens at last  4 visits  Glucose control: A1c nl 07/2017  UA: microhematuria in 07/2017 --> resolved on repeat UA 10/2017. Trace proteinuria 01/2019. Normal exam 08/2019     Sexual health and STI screening  Syphilis: serofast RPR 1:4 in 02/2017 after tx for early syphilis in 01/2016 (initial RPR 1:32) --> 1:1 in 10/2017 (likely new serofast titer) --> 1:1 in 03/2018, 08/2018, 01/2019, 08/2019 --> 1:4 in 01/2020 (treated for EL syphilis with BIC x1) --> back down to serofast titer 1:1 in 04/2020 --> NR in 01/2021, 03/2021  GC/CT: neg screen x3 in 10/2017, 08/2018, 01/2020, 05/2020 (then throat neg 06/2020 - tested at time of sore throat). Last screen neg x3 sites in  03/2021  Sexual pleasure: MSM, vers. Able to derive pleasure from sex, although sex has never been the focus of his relationships. No ED or libido changes associated with duloxetine use.   HPV status: declined series  HSV status: no known outbreaks     Cancer screening  Lung: N/A (never tobacco user)  Anal: address as needed at age 73    Immunizations/Serologies and misc testing  Declines flu, COVID bivalent booster, Menveo, TDaP and HPV vaccines   Quantiferon neg 01/2016  Toxo neg 01/2016  HAV immune 01/2016  HBsAb pos (titer 82), sAg neg, cAb neg 01/2016  HCV NR 02/2017, 04/2020    Immunization History   Administered Date(s) Administered    COVID-19 Pfizer mRNA monovalent 12 yrs and older (purple cap) 01/28/2019, 02/17/2019    COVID-19 Pfizer mRNA monovalent tris-sucrose 12 yrs and older (gray cap) 06/21/2020    Influenza quadrivalent PF 02/08/2016, 10/10/2016    Mpox/smallpox (vaccinia) live 09/03/2020, 10/07/2020    Pneumococcal conjugate PCV13 (Prevnar 13) 05/02/2016    Pneumococcal polysaccharide PPSV23 (Pneumovax 23) 10/10/2016

## 2021-07-31 MED ORDER — DOLUTEGRAVIR SODIUM 50 MG OR TABS
50.0000 mg | ORAL_TABLET | Freq: Every day | ORAL | 5 refills | Status: DC
Start: 2021-07-31 — End: 2022-02-20

## 2021-07-31 MED ORDER — DESCOVY 200-25 MG OR TABS
1.0000 | ORAL_TABLET | Freq: Every day | ORAL | 5 refills | Status: DC
Start: 2021-07-31 — End: 2022-02-19

## 2021-07-31 MED ORDER — DULOXETINE HCL 60 MG OR CPEP
60.0000 mg | DELAYED_RELEASE_CAPSULE | Freq: Every day | ORAL | 5 refills | Status: DC
Start: 2021-07-31 — End: 2022-03-13

## 2021-08-01 ENCOUNTER — Other Ambulatory Visit (HOSPITAL_BASED_OUTPATIENT_CLINIC_OR_DEPARTMENT_OTHER): Payer: PRIVATE HEALTH INSURANCE | Admitting: Hematology

## 2021-08-01 DIAGNOSIS — D709 Neutropenia, unspecified: Secondary | ICD-10-CM

## 2021-08-01 NOTE — Progress Notes (Signed)
Reason for eConsult:  Joylene Draft, MD submitted the following request:  NOTE: Please ensure all required labs listed in this template are complete before ordering an eConsult.   I am requesting an eConsult for this 31 year old man with HIV (virally suppressed on ART, CD4 551 in 10/2020) with ANC 570/17%.    My clinical question is: Etiology and or necessary work-up for asymptomatic neutropenia? 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   All medications and supplements (prescription and OTC) are accurately reflected in my last office note, dated  07/31/21.    The following studies are required and available in [facility]:   - CBC with differential, Reticulocyte count, peripheral smear, LFTs.   - Ultrasound of liver and spleen (or other abdominal imaging), if available  - ANA reflexive panel, if available  Lab Results       Component                Value               Date                       WBC                      3.28                07/30/2021                 RBC                      4.42                07/30/2021                 HEMOGLOBIN               13.3                07/30/2021                 HEMATOCRIT               37                  07/30/2021                 MCH                      30.1                07/30/2021                 MCHC                     35.7                07/30/2021                 RDWCV                    13.6                07/30/2021                 PERNEUT  17                  07/30/2021                 PERLYMPH                 71                  07/30/2021                 PERMONO                  10                  07/30/2021                 PEREOS                   2                   07/30/2021                 PERBASO                  0                   07/30/2021                 ANEUT                    0.57                 07/30/2021                 ALYMPH                   2.34                07/30/2021                 AEOS                     0.05                07/30/2021                 ABASO                    0.01                07/30/2021                 AIMG                     0.00                07/30/2021                 [facility]                                     07/30/2021             See DIFF - No additional morphologic findings       PLTMORPH  07/30/2021             See Platelet value - No additional morphologic findings  No results found for: RETICP, RETABS  Lab Results       Component                Value               Date                       [facility]                                     07/30/2021             See DIFF - No additional morphologic findings       [facility]                                     07/30/2021             See CBC - No additional morphologic findings       PLTMORPH                                     07/30/2021             See Platelet value - No additional morphologic findings  Lab Results       Component                Value               Date                       ALT                      23                  02/08/2019                 AST                      18                  02/08/2019                 ALK                      93                  02/08/2019                 ALBUMIN                  4.4                 02/08/2019                 PROTEIN                  7.4                 02/08/2019  INR                      1.1                 02/08/2016            Please note:    All relevant CBC results can be viewed in [facility], [place] or [facility]  If this clinical question is deemed too complex for eConsult, please;  route back to me and I will discuss further with the patient.  Sherlene Shams, MD  07/31/2021  _____________________________________________________________________  After careful review of the patient's results above  and the patient's information available in the medical record,  the following are my findings and recommendations:  Alinda Money, MD  08/01/2021    1. Restatement of the question: Mr. Ashe is a 31 year old male who is referred for isolated, longstanding neutropenia.  He has HIV and is currently taking antiretroviral therapy with a CD4 count of 550/mcL in 10/22.    This patient is noted to have an isolated, asymptomatic neutropenia.     2. Recommendation(s): It would be useful to request a pathology review of the peripheral blood smear, order flow cytometry on the peripheral blood, and order left upper quadrant ultrasound (to exclude splenomegaly).  Provided this evaluation proves to be unremarkable/unrevealing, the most likely causes for neutropenia would be "constitutional" (formally known as benign ethnic) neutropenia, HIV itself, or one of the antiretroviral medications (or some combination of these factors).  Autoimmune neutropenia would also be in the differential diagnosis but seems less likely given the other potential explanations.  I do not think bone marrow aspiration and biopsy is filgrastim (targeting a higher neutrophil count) are indicated at this point.  The otherwise normal CBC with differential argues against a more ominous (or marrow-related) because of the neutropenia.    Consider ordering testing for the "Duffy null [Fy(a-b-)] phenotype", a variant found in many persons of African descent which is associated with slightly decreased (when compared to persons of Western European descent) absolute neutrophil counts but is of no other consequence.  If Mr. Snelling  has the "Duffy null" phenotype, it would explain the mildly decreased ANC.    The relatively lower neutrophil counts associated with the Duffy null [Fy(a-b-)] phenotype has historically been called "benign ethnic" or "constitutional" neutropenia.  It is often seen in patients of [place], [place], [place], [place], [place],  or Arab descent.    The [facility] test code is [id number] (single Ag test phenotype) - specify "r/o Duffy null [Fy(a-b-)] phenotype" in the comments for the lab.      3. Contingency plan: Please contact hematology if the absolute neutrophil count drops below 500/mcL or if the neutropenia is associated with unusual fevers, infections, oral ulcers, lymphadenopathy, new splenomegaly, rashes, or other abnormalities seen on CBC (cytopenias, nucleated RBCs, or immature WBCs). Under such circumstances, we would consider bone marrow biopsy and/or filgrastim.    I spent a total time of 12 minutes reviewing and communicating the above findings and recommendations.    "This eConsult is based solely on the clinical information available to me in the patient's medical record and is provided without benefit of a comprehensive evaluation or physical examination of the patient. The information contained in this eConsult must be interpreted in light of any clinical issues or changes in patient status that were not known to me at the time this eConsult was completed. You must  rely on your own informed clinical judgment for decision making. If necessary, we can schedule the patient for an in-office consultation."

## 2021-08-02 ENCOUNTER — Other Ambulatory Visit (HOSPITAL_BASED_OUTPATIENT_CLINIC_OR_DEPARTMENT_OTHER): Payer: Self-pay | Admitting: Internal Medicine

## 2021-08-02 ENCOUNTER — Encounter (HOSPITAL_BASED_OUTPATIENT_CLINIC_OR_DEPARTMENT_OTHER): Payer: Self-pay | Admitting: Internal Medicine

## 2021-08-02 DIAGNOSIS — D709 Neutropenia, unspecified: Secondary | ICD-10-CM

## 2021-08-02 LAB — HIV1 RNA QUANTITATION: HIV RNA Result: NOT DETECTED {copies}/mL

## 2021-08-02 LAB — RPR QUANTITATIVE (SENDOUT): RPR Quantitative (Sendout): NONREACTIVE

## 2021-09-12 ENCOUNTER — Ambulatory Visit
Admit: 2021-09-12 | Discharge: 2021-09-12 | Disposition: A | Discharge status: Home / Self Care | Payer: 59 | Attending: Internal Medicine | Admitting: Internal Medicine

## 2021-09-14 ENCOUNTER — Encounter (HOSPITAL_BASED_OUTPATIENT_CLINIC_OR_DEPARTMENT_OTHER): Payer: Self-pay | Admitting: Infectious Disease

## 2021-09-15 ENCOUNTER — Other Ambulatory Visit (HOSPITAL_BASED_OUTPATIENT_CLINIC_OR_DEPARTMENT_OTHER): Payer: Self-pay | Admitting: Infectious Disease

## 2021-09-18 NOTE — Telephone Encounter (Signed)
Call placed to patient and appointment scheduled for tomorrow with provider.  Pt has rash on right elbow that is worsening over past week.

## 2021-09-19 ENCOUNTER — Ambulatory Visit: Payer: 59 | Attending: Infectious Disease | Admitting: Infectious Disease

## 2021-09-19 VITALS — BP 114/70 | HR 82 | Temp 97.9°F | Wt 174.0 lb

## 2021-09-19 DIAGNOSIS — R21 Rash and other nonspecific skin eruption: Secondary | ICD-10-CM | POA: Insufficient documentation

## 2021-09-19 MED ORDER — TRIAMCINOLONE ACETONIDE 0.1 % EX OINT
TOPICAL_OINTMENT | Freq: Two times a day (BID) | CUTANEOUS | 2 refills | Status: AC
Start: 2021-09-19 — End: ?

## 2021-09-19 NOTE — Progress Notes (Signed)
[facility]  FOLLOW-UP VISIT      ID/CC: Jacquez Sheetz is a 31 year old man with HIV who typically sees Dr. Lady Gary here to discuss elbow rash.    ASSESSMENT/PLAN:   1. HIV disease (HCC): DTG and F/TAF. No new issues with adherence. Labs last checked in July. Seeing Dr. Lady Gary.     2. Other neutropenia (HCC): Longstanding issue. Dr. Lady Gary recently e-consulted hematology for their thoughts.     3. Allergic contact dermatitis, unspecified trigger: Faint papular rash of unclear etiology. Itchiness suggests a component of histamine - ? allergic contact. Doesn't look typical for psoriasis, keratosis pilaris, eczema. Will try triamcinalone 1% when it first starts (if it returns) and see if that keeps it from flaring. If it comes back despite this, would consider referring to derm.       I spent a total of 20 minutes for the patient's care on the date of the service.        _____________________________  HISTORY OF PRESENT ILLNESS:   - Doing well overall. Typically sees Dr. Lady Gary but wanted to discuss a rash on his elbow. First mentioned it to Dr. Lady Gary in July but it went away. However a few weeks ago it returned with the same papular presentation. Became itchy. No surrounding erythema. No clear triggers. No new soaps or pets or foreign travel. Has been moisturizing it aggressively. Also tried neosporin. Notes that over the last couple days it has now begun to resolve and no longer is itchy and is clearing up but he would still like to know what caused it. Did not recall that Dr. Lady Gary had suggested he try triamcinalone if it returned. He thought Dr. Lady Gary thought it was dry skin and has therefore been moisturizing.       Current Outpatient Medications:     dolutegravir 50 MG tablet, Take 1 tablet (50 mg) by mouth daily., Disp: 30 tablet, Rfl: 5    DULoxetine 60 MG DR capsule, Take 1 capsule (60 mg) by mouth daily., Disp: 30 capsule, Rfl: 5    emtricitabine-tenofovir alafenamide (Descovy) 200-25 MG tablet,  Take 1 tablet by mouth daily., Disp: 30 tablet, Rfl: 5    triamcinolone 0.1 % ointment, Apply topically 2 times a day. Apply to R elbow twice a day PRN., Disp: 30 g, Rfl: 2      PHYSICAL EXAMINATION:   Vital Signs: BP 114/70   Pulse 82   Temp 36.6 C (Temporal)   Wt 78.9 kg (174 lb)   SpO2 97%   BMI 24.27 kg/m   Constitutional: Well-appearing, well-groomed, in no distress  Skin: Faint skin-colored papular eruption over R olecranon, no erythema or weeping, no hives, nontender to touch, no LAD               Immunization History   Administered Date(s) Administered    COVID-19 Pfizer mRNA monovalent 12 yrs and older (purple cap) 01/28/2019, 02/17/2019    COVID-19 Pfizer mRNA monovalent tris-sucrose 12 yrs and older (gray cap) 06/21/2020    Influenza quadrivalent PF 02/08/2016, 10/10/2016    Mpox/smallpox (vaccinia) live 09/03/2020, 10/07/2020    Pneumococcal conjugate PCV13 (Prevnar 13) 05/02/2016    Pneumococcal polysaccharide PPSV23 (Pneumovax 23) 10/10/2016

## 2021-10-08 ENCOUNTER — Encounter (HOSPITAL_BASED_OUTPATIENT_CLINIC_OR_DEPARTMENT_OTHER): Payer: Self-pay | Admitting: Counselor

## 2021-10-08 NOTE — Progress Notes (Signed)
DOCUMENT TYPE:    Social Work - Follow-up Note    PATIENT:    Dennis [name], Dennis [name]  MRN:    [id number]  DOB:    [birth year]    ENCOUNTER DATE:  10/08/2021    ASSOCIATED PROGRAM-CLINIC:  [facility]    CONTACT TYPE:  Telephone    BRIEF DESCRIPTION:  RE: EIP/ ESI vs QHP/ Service plan    PATIENT DESCRIPTION, PRESENTING PROBLEM, PATIENT / FAMILY GOALS:  31 year old African American gay male with HIV, CD4 count 83, VL undetectable, on ART meds established HIV care and case management at [facility] clinic. Pt diagnosed with HIV on 03/21/2014 previous provider Dr. Scharlene Gloss at [place], [place]. His family does not know about his diagnosis, but some his friends know. [name] works full time, on [employer]. EIP will expire on 10/31. Service plan expired.    INTERVENTION / OUTCOME / PLAN:  SWR communicated with pt. about medical coverage, medical bills, EIP and update service plan. [name] said he still receives call about unpaid medical bills. Writer checked medical coverage in EPIC shows that [employer] as primary and [employer] as secondary. SWR will check with pt. financial acct about medical bills; pt. will e-mail these bills. [name] will e-mail 2 mos paystubs to complete EIP renewal. Went through service plan and completed. Pt. is taking ART meds regularly.    Plan:  1. Assist pt. to engage with medical care.  2. Assist pt. with medical coverage.  3. Assist pt. with medical bills.  4. Support with adherence to HAART meds.    DOCUMENT COMPOSED BY:   Nicholes Calamity, MSW [phone] on  10/08/2021

## 2021-10-18 ENCOUNTER — Ambulatory Visit
Admission: RE | Admit: 2021-10-18 | Discharge: 2021-10-18 | Disposition: A | Discharge status: Home / Self Care | Payer: 59 | Attending: Diagnostic Radiology | Admitting: Diagnostic Radiology

## 2021-10-18 DIAGNOSIS — D709 Neutropenia, unspecified: Secondary | ICD-10-CM | POA: Insufficient documentation

## 2021-11-01 ENCOUNTER — Other Ambulatory Visit: Payer: Self-pay

## 2021-11-01 ENCOUNTER — Encounter (HOSPITAL_BASED_OUTPATIENT_CLINIC_OR_DEPARTMENT_OTHER): Payer: Self-pay | Admitting: Counselor

## 2021-11-01 NOTE — Progress Notes (Signed)
DOCUMENT TYPE:    Social Work - Follow-up Note    PATIENT:    Dennis [name], Dennis [name]  MRN:    [id number]  DOB:    [birth year]    ENCOUNTER DATE:  10/31/2021    ASSOCIATED PROGRAM-CLINIC:  [facility]    CONTACT TYPE:  Telephone    BRIEF DESCRIPTION:  EIP renewal/ ROI/ Medical bill/ Medical coverage    PATIENT DESCRIPTION, PRESENTING PROBLEM, PATIENT / FAMILY GOALS:  31 year old African American gay male with HIV, CD4 count 51, VL undetectable, on ART meds established HIV care and case management at [facility] clinic. Pt diagnosed with HIV on 03/21/2014 previous provider Dr. Scharlene Gloss at [place], [place]. His family does not know about his diagnosis, but some his friends know. [name] works full time, on [employer]. EIP will expire on 10/31.    RE: EIP will expire on 10/31 and medical bills    INTERVENTION / OUTCOME / PLAN:  SWR communicated with pt. about medical bills and coverages. Pt. is on ESI coverage and QHP; Educated pt. about QHP and [place] re: paying medical bills. Pt. will choose to keep one of the insurances and he said he wants to keep [facility] and continue to have eye and dental coverage through employer. [name] said he will email the copy of medical bill; writer will assist pt. upon receiving his email. Printed e-mailed paystubs and completed EIP form. Pt. gave verbal consent to update release of information. Writer will make a f/u re: changes in medical coverage.    Plan:  1. Assist pt. to engage with medical care.  2. Assist pt. with EIP renewal.  3. Assist pt. with medical bills.    DOCUMENT COMPOSED BY:   Nicholes Calamity, MSW [phone] on  11/01/2021

## 2021-11-06 ENCOUNTER — Encounter (HOSPITAL_BASED_OUTPATIENT_CLINIC_OR_DEPARTMENT_OTHER): Payer: Self-pay | Admitting: Counselor

## 2021-11-06 NOTE — Progress Notes (Signed)
DOCUMENT TYPE:    Social Work - Follow-up Note    PATIENT:    Dennis [name], Dennis [name]  MRN:    [id number]  DOB:    09-30-1990    ENCOUNTER DATE:  11/05/2021    ASSOCIATED PROGRAM-CLINIC:  [facility]    CONTACT TYPE:  Collateral    BRIEF DESCRIPTION:  Re: EHIP/EIP/WHPF    PATIENT DESCRIPTION, PRESENTING PROBLEM, PATIENT / FAMILY GOALS:  31 year old African American gay male with HIV, CD4 count 412, VL undetectable, on ART meds established HIV care and case management at [facility] clinic. Pt diagnosed with HIV on 03/21/2014 previous provider Dr. Scharlene Gloss at [place], [place]. His family does not know about his diagnosis, but some his friends know. [name] works full time, on [employer]. EIP will expire on 10/31.    Received email from [place] to complete EHIP if pt. needs premium payment? WHPF acct shows pt. is active on [employer] since 01/14/2021?    INTERVENTION / OUTCOME / PLAN:  SWR responded to [facility] email after checking pt's WHPF acct. "[name] has been active on [place] in WHPF since 01/01/20023, I do not know what happened? Would you please check if premium paid or terminated? However, pt. lost his job last Thursday October 19, his company closed; his last employer insurance will end this month.    [place] Would you please change no ESI coverage beginning 11/14/2021?  He also needs EHIP form completed for QHP if we need to enroll all over again. I do not understand how he is still active on [employer] if premium was not paid?"    Plan:  1. Assist pt. with medical coverage and premium.    DOCUMENT COMPOSED BY:   Nicholes Calamity, MSW [phone] on  11/06/2021

## 2021-11-06 NOTE — Progress Notes (Signed)
DOCUMENT TYPE:    Social Work - Follow-up Note    PATIENT:    Dennis [name], Dennis [name]  MRN:    [id number]  DOB:    Sep 28, 1990    ENCOUNTER DATE:  11/05/2021    ASSOCIATED PROGRAM-CLINIC:  [facility]    CONTACT TYPE:  Telephone    BRIEF DESCRIPTION:  RE: Medical insurance/ Lost job    PATIENT DESCRIPTION, PRESENTING PROBLEM, PATIENT / FAMILY GOALS:  31 year old African American gay male with HIV, CD4 count 60, VL undetectable, on ART meds established HIV care and case management at [facility] clinic. Pt diagnosed with HIV on 03/21/2014 previous provider Dr. Scharlene Gloss at [place], [place]. His family does not know about his diagnosis, but some his friends know. Dennis [name] works full time, on [employer] and EIP coverage.    SWR called pt. after receiving email from [facility] insurance advocate re: premium payment.    INTERVENTION / OUTCOME / PLAN:  SWR communicated with Dennis [name] about ESI coverage, [employer] and [employer]. Pt. said his company closed last Thursday on Oct 19 and he lost job. Dennis [name] said his employer sponsored insurance only works thru the end of this month 11/13/2021. Writer explained to pt. email received from [facility] insurance advocate about premium. Will get back to pt. after communicating this new information pt. provided about ESI end date to [place] and [facility]. Pt. agreed and pt. received meds refill recently.    Plan:  1. Assist pt. with medical coverage.    DOCUMENT COMPOSED BY:   Nicholes Calamity, MSW [phone] on  11/06/2021

## 2021-11-06 NOTE — Progress Notes (Signed)
DOCUMENT TYPE:    Social Work - Follow-up Note    PATIENT:    [name], [name]  MRN:    [id number]  DOB:    March 08, 1990    ENCOUNTER DATE:  11/06/2021    ASSOCIATED PROGRAM-CLINIC:  [facility]    CONTACT TYPE:  Collateral    BRIEF DESCRIPTION:  RE: Kendra Opitz premium/ [employer] termed    PATIENT DESCRIPTION, PRESENTING PROBLEM, PATIENT / FAMILY GOALS:  31 year old African American gay male with HIV, CD4 count 412, VL undetectable, on ART meds established HIV care and case management at [facility] clinic. Pt diagnosed with HIV on 03/21/2014 previous provider Dr. Scharlene Gloss at [place], [place]. His family does not know about his diagnosis, but some his friends know. The company [name] was working closed on October 19th and he lost his job, his [employer] through employer will end 11/13/2021. His [employer] shows active beginning 01/14/2021 thru 01/13/2022. Pt. is on EIP coverage.    INTERVENTION / OUTCOME / PLAN:  SWR communicated with [facility] EHIP insurance advocate and [facility] to find out continuation of [place] coverage and [employer]. Harini from [facility] said [place] premium discontinued 03/13/2021. Writer is checking with EHIP, [place] contact person and [facility] if premium reinstated to continue with medical coverage.    Received email from [employer] verifying discontinuation of premium payment on 03/13/2021. There is question how pt's Estevan Ryder still shows active and check with contact person. Asking Amber to check with EHIP contact person whether if there is any past due balance and if client can continue the same enrollment since his ESI end soon? Writer will continue email back and forth with EHIP, [place], [place] to solve this issue.    Plan:  1. Assist pt. with medical coverage.    DOCUMENT COMPOSED BY:   Nicholes Calamity, MSW [phone] on  11/06/2021

## 2021-11-06 NOTE — Progress Notes (Signed)
DOCUMENT TYPE:    Social Work - Follow-up Note    PATIENT:    Kiaan, Overholser  MRN:    [id number]  DOB:    [birth year]    ENCOUNTER DATE:  11/06/2021    ASSOCIATED PROGRAM-CLINIC:  [facility]    CONTACT TYPE:  Collateral    BRIEF DESCRIPTION:  RE: Estevan Ryder cascade and premium payment    PATIENT DESCRIPTION, PRESENTING PROBLEM, PATIENT / FAMILY GOALS:  31 year old African American gay male with HIV, CD4 count 412, VL undetectable, on ART meds established HIV care and case management at [facility] clinic. Pt diagnosed with HIV on 03/21/2014 previous provider Dr. Scharlene Gloss at [place], [place]. His family does not know about his diagnosis, but some his friends know. [name] works full time, on [employer]. EIP will expire on 10/31.    INTERVENTION / OUTCOME / PLAN:  [place] contact person sent case# [phone] showing he is currently active with a future term date of 01/13/2022. Here is a breakdown of the policy:    85/63/1497-02/63/7858 Premium-$364.92  01/14/2021-current Premium-$406.15  Total= $8502.77 Total Paid=$5191.34  Total amount Due=$3655.35    Emailed Lavena Bullion explaining pt's present situation about loss of employment and ESI coverage will end on 11/13/2021. Requested to check with Wille Glaser to pay balance due to keep pt. on [place] coverage. EHIP did not pay ESI premium, and pt. is active on EIP during this time. Waiting on response.    Plan:  1. Support pt. with medical coverage and premium payment.    DOCUMENT COMPOSED BY:   Nicholes Calamity, MSW [phone] on  11/06/2021

## 2021-11-12 ENCOUNTER — Encounter (HOSPITAL_BASED_OUTPATIENT_CLINIC_OR_DEPARTMENT_OTHER): Payer: Self-pay | Admitting: Counselor

## 2021-11-12 NOTE — Progress Notes (Signed)
DOCUMENT TYPE:    Social Work - Follow-up Note    PATIENT:    [name], [name]  MRN:    [id number]  DOB:    1990-01-19    ENCOUNTER DATE:  11/09/2021    ASSOCIATED PROGRAM-CLINIC:  [facility]    CONTACT TYPE:  Telephone    BRIEF DESCRIPTION:  Re: Complete Unemployment benefit/ WHPF/ EHIP/ [facility]    PATIENT DESCRIPTION, PRESENTING PROBLEM, PATIENT / FAMILY GOALS:  31 year old African American gay male with HIV, CD4 count 412, VL undetectable, on ART meds established HIV care and case management at [facility] clinic. Pt diagnosed with HIV on 03/21/2014 previous provider Dr. Scharlene Gloss at [place], [place]. His family does not know about his diagnosis, but some his friends know. [name] works full time, on [employer]. EIP will expire on 10/31.    Received e-mail from [facility] for pt. to update HPF and income before paying past paying premiums.    INTERVENTION / OUTCOME / PLAN:  SWR called [place] to tell him re: EHIP request to provide unemployment income and update WHPF. While Education officer, museum on the phone he completed online unemployment benefit. He qualified for weekly payment of $585, calculated $2515.50 per month. Pt. gave verbal consent to update WHPF, EHIP and ROI. Writer updated on pt's WHPF acct re: change loss of job/ change of monthly income. Pt. is already active on [facility] and still continues with this coverage. Writer completed EHIP form and faxed to Harini. SWR notified the above HPL change and new income to [facility] insurance advocate [facility] and [place].    Plan:  1. Assist pt. with medical insurance.    DOCUMENT COMPOSED BY:   Nicholes Calamity, MSW [phone] on  11/12/2021

## 2021-11-12 NOTE — Progress Notes (Signed)
DOCUMENT TYPE:    Social Work - Follow-up Note    PATIENT:    Dennis [name], Dennis [name]  MRN:    [id number]  DOB:    1990/12/22    ENCOUNTER DATE:  11/09/2021    ASSOCIATED PROGRAM-CLINIC:  [facility]    CONTACT TYPE:  Collateral    BRIEF DESCRIPTION:  Multiple communication with EHIP    PATIENT DESCRIPTION, PRESENTING PROBLEM, PATIENT / FAMILY GOALS:  31 year old African American gay male with HIV, CD4 count 412, VL undetectable, on ART meds established HIV care and case management at [facility] clinic. Pt diagnosed with HIV on 03/21/2014 previous provider Dr. Scharlene Gloss at [place], [place]. His family does not know about his diagnosis, but some his friends know. [name] works full time, on [employer]. EIP will expire on 10/31.    RE: EHIP multiple communication about [name] coverage. Harni asked new EHIP application to pay balance due.    INTERVENTION / OUTCOME / PLAN:  Harini EHIP insurance advocate emailed asking re: recent use of [employer] coverage. SWR responded to Harini that pt. used [place] cascade for medication refill. He uses [place] and prefers to use for medical and meds coverage.    Lavena Bullion who is [facility] / Quality Analyst asked if pt. qualify for [employer] and also asked writer to update in HPF. SWR emailed [facility] team and [facility] that pt. is over income for  [facility], pt. approved with unemployment benefit with $585/weekly. Writer updated WHPF acct with new income, and also Probation officer faxed new EHIP form. Writer asked to not be putting premium payment on hold.    SWR received another email from [place] administrator that he will send a check out right away when they get the updated rate for November. SWR went through pt's WHPF acct application and not find recorded ESI in the system. Writer notified both Harini and Wille Glaser and asked Harini to check in the system about premium payment. This communication between [facility] team and Probation officer took for longer time, all day long,  back and forth emails. SWR consulted [facility] about long day back and forth communications/ emails/ WHPF done with EHIP team and asked [place] to help pt. with EIP grp#3 incase EHIP delay paying balance due premium.    Plan:  1. Assist pt. with premium payment.  2. F/u with [facility] insurance advocate.    DOCUMENT COMPOSED BY:   Nicholes Calamity, MSW [phone] on  11/12/2021

## 2021-11-14 ENCOUNTER — Encounter (HOSPITAL_BASED_OUTPATIENT_CLINIC_OR_DEPARTMENT_OTHER): Payer: Self-pay | Admitting: Counselor

## 2021-11-14 NOTE — Progress Notes (Signed)
DOCUMENT TYPE:    Social Work - Follow-up Note    PATIENT:    Dennis [name], Dennis [name]  MRN:    [id number]  DOB:    [birth year]    ENCOUNTER DATE:  11/13/2021    ASSOCIATED PROGRAM-CLINIC:  [facility]    CONTACT TYPE:  Collateral    BRIEF DESCRIPTION:  RE: WHPF/ ESI/ QHP/ Premium payment    PATIENT DESCRIPTION, PRESENTING PROBLEM, PATIENT / FAMILY GOALS:  31 year old African American gay male with HIV, CD4 count 412, VL undetectable, on ART meds established HIV care and case management at [facility] clinic. Pt diagnosed with HIV on 03/21/2014 previous provider Dr. Scharlene Gloss at [place], [place]. His family does not know about his diagnosis, but some his friends know. [name] works full time, on [employer] and [employer]. EIP will expire on 10/31.    RE: ESI in [facility], tax credit, EHIP for premium payment    INTERVENTION / ZESPQZR / PLAN:  SWR received email from [employer] administrator re: ESI and no tax credit issue. EHIP administrator said he hold premium balance payment until ESI removed. However, this did not happen, Probation officer and [employer] advocate would not be able to find ESI in pt. WHPF acct. SWR and Harini reported to Wille Glaser and asked if he will go ahead pay premium balance for continuity of [employer] coverage.    Writer attempted to check WHPF on this day but WHPF down for maintenance. SWR responded to multiple emails from [facility] to assist pt. with QHP coverage premium payment even no tax credit to keep [place] on the same coverage.    Plan:  1. Assist pt. with QHP premium payment.    DOCUMENT COMPOSED BY:   Nicholes Calamity, MSW [phone] on  11/14/2021

## 2021-11-26 ENCOUNTER — Encounter (HOSPITAL_BASED_OUTPATIENT_CLINIC_OR_DEPARTMENT_OTHER): Payer: Self-pay | Admitting: Counselor

## 2021-11-27 NOTE — Progress Notes (Signed)
DOCUMENT TYPE:    Social Work - Follow-up Note    PATIENT:    Dennis [name], House  MRN:    [id number]  DOB:    [birth year]    ENCOUNTER DATE:  11/27/2021    ASSOCIATED PROGRAM-CLINIC:  [facility]    CONTACT TYPE:  Telephone    BRIEF DESCRIPTION:  2024 QHP/ Meds coverage/ EHIP/ ROI    PATIENT DESCRIPTION, PRESENTING PROBLEM, PATIENT / FAMILY GOALS:  31 year old African American gay male with HIV, CD4 count 412, VL undetectable, on ART meds established HIV care and case management at [facility]. Pt diagnosed with HIV on 03/21/2014 previous provider Dr. Staci Righter at [place], [place]. His family does not know about his diagnosis, but some his friends know. [name] lost job, on unemployment benefit, on [place], [place] and EIP coverage.    RE: Pt. is on Descovy and need to change from [facility] to other insurance coverage.    INTERVENTION / OUTCOME / PLAN:  SWR communicated to [name] that [employer] is not going to cover Descovy for next year. Pt. wants to keep his ART meds and would like to change QHP that covers Descovy. Educated pt. about 2024 QHP [employer] Cascade and [employer] coverage. Pt. would like to have coverage that pays for Descovy. Pt. gave verbal consent to enroll 2024 QHP, EHIP and ROI. Will complete after 12/14/2021.    Plan:  1. Assist pt. with medical coverage.  2. Assist pt. with QHP premium payment.    DOCUMENT COMPOSED BY:   [name], MSW [phone] on  11/27/2021

## 2021-11-29 ENCOUNTER — Encounter (HOSPITAL_BASED_OUTPATIENT_CLINIC_OR_DEPARTMENT_OTHER): Payer: Self-pay | Admitting: Social Work

## 2021-11-29 NOTE — Progress Notes (Signed)
DOCUMENT TYPE:    Social Work - Follow-up Note    PATIENT:    Dennis [name], Dennis [name]  MRN:    [id number]  DOB:    [birth year]    ENCOUNTER DATE:  11/29/2021    ASSOCIATED PROGRAM-CLINIC:  [facility]    CONTACT TYPE:  Telephone    BRIEF DESCRIPTION:  Funding for Medication Copays    PATIENT DESCRIPTION, PRESENTING PROBLEM, PATIENT / FAMILY GOALS:  This 31 year old, African American, gay, male, [facility] patient was reported by his primary HIV/AIDS case manager, [name], to be having trouble accessing medication refills. Specifically, patient's pharmacy was asking for patient to self-pay the costs not entirely covered by his health insurance (e.g., copays, coinsurance, etc.). Per patient's electronic case management record, patient has enrollment with Early Intervention Program (EIP) as a secondary funder. Patient uses a pharmacy outside of [facility].    INTERVENTION / OUTCOME / PLAN:  At Ms. Duressa's request, this Child psychotherapist provided patient with needed assistance. Patient was reminded of his secondary funding (EIP) and provided with information needed for his pharmacy to bill for copays etc. (Ramsell ID: [id number]; Bin #: [id number]; PCN: [id number]; and Group #: [id number]). Patient confirmed that his pharmacy was able to successfully bill his secondary funding.    DOCUMENT COMPOSED BY:   Cherlynn Kaiser, LICSW [phone] on  11/29/2021

## 2021-12-04 ENCOUNTER — Encounter (HOSPITAL_BASED_OUTPATIENT_CLINIC_OR_DEPARTMENT_OTHER): Payer: Self-pay | Admitting: Counselor

## 2021-12-05 ENCOUNTER — Encounter (HOSPITAL_BASED_OUTPATIENT_CLINIC_OR_DEPARTMENT_OTHER): Payer: Self-pay | Admitting: Counselor

## 2021-12-05 NOTE — Progress Notes (Signed)
DOCUMENT TYPE:    Social Work - Follow-up Note    PATIENT:    Dennis [name], Dennis [name]  MRN:    [id number]  DOB:    10/08/1990    ENCOUNTER DATE:  12/05/2021    ASSOCIATED PROGRAM-CLINIC:  [facility]    CONTACT TYPE:  Collateral    BRIEF DESCRIPTION:  WHPF upload failed    PATIENT DESCRIPTION, PRESENTING PROBLEM, PATIENT / FAMILY GOALS:  31 year old African American gay male with HIV, CD4 count 412, VL undetectable, on ART meds established HIV care and case management at [facility] clinic. Pt diagnosed with HIV on 03/21/2014 previous provider Dr. Staci Righter at [place], [place]. His family does not know about his diagnosis, but some his friends know. [name] lost job, on unemployment benefit, on [place], [place] and EIP coverage.    WHPF requested to upload household income verification, due date 02/12/2022.    INTERVENTION / OUTCOME / PLAN:  SWR received unemployment benefit approval letter, scanned this document and attempted to upload to [facility] acct, but [facility] system failed couple of times. Will wait for next week.    Plan:  1. Assist pt. with 2024 QHP coverage.    DOCUMENT COMPOSED BY:   [name], MSW [phone] on  12/05/2021

## 2021-12-05 NOTE — Progress Notes (Signed)
DOCUMENT TYPE:    Social Work - Follow-up Note    PATIENT:    Rishon, Thilges  MRN:    [id number]  DOB:    [birth year]    ENCOUNTER DATE:  12/04/2021    ASSOCIATED PROGRAM-CLINIC:  [facility]    CONTACT TYPE:  Telephone    BRIEF DESCRIPTION:  RE: WHPF requested document    PATIENT DESCRIPTION, PRESENTING PROBLEM, PATIENT / FAMILY GOALS:  31 year old African American gay male with HIV, CD4 count 412, VL undetectable, on ART meds established HIV care and case management at [facility] clinic. Pt diagnosed with HIV on 03/21/2014 previous provider Dr. Staci Righter at [place], [place]. His family does not know about his diagnosis, but some his friends know. [name] lost job, on unemployment benefit, on [place], [place] and EIP coverage.    WHPF requested to upload household income verification, due date 02/12/2022.    INTERVENTION / OUTCOME / PLAN:  SWR called [name] to tell him about WHPF request and e-mail unemployment benefit verification document. [name] said he did not get unemployment check, but he has document that shows how much he will get weekly. Suggested pt. to go unemployment office with document. He will e-mail this document.    Plan:  1. Assist pt. with 2024 QHP enrollment.    DOCUMENT COMPOSED BY:   [name], MSW [phone] on  12/05/2021

## 2021-12-10 ENCOUNTER — Encounter (HOSPITAL_BASED_OUTPATIENT_CLINIC_OR_DEPARTMENT_OTHER): Payer: Self-pay | Admitting: Counselor

## 2021-12-10 NOTE — Progress Notes (Signed)
DOCUMENT TYPE:    Social Work - Follow-up Note    PATIENT:    [name], Dennis  MRN:    [id number]  DOB:    Apr 05, 1990    ENCOUNTER DATE:  12/10/2021    ASSOCIATED PROGRAM-CLINIC:  [facility]    CONTACT TYPE:  Telephone    BRIEF DESCRIPTION:  WHPF/ Enrolled to [employer] Cascade    PATIENT DESCRIPTION, PRESENTING PROBLEM, PATIENT / FAMILY GOALS:  31 year old African American gay male with HIV, CD4 count 412, VL undetectable, on ART meds established HIV care and case management at [facility] clinic. Pt diagnosed with HIV on 03/21/2014 previous provider Dr. Staci Righter at [place], [place]. His family does not know about his diagnosis, but some his friends know. [name] lost job, on unemployment benefit, on [place], [place] and EIP coverage.    Pt. is taking Descovy, this med is not going to be covered by [facility] for next year.    INTERVENTION / OUTCOME / PLAN:  SWR communicated with pt. to assist with online WHPF to update and renew medical coverage. Went through [employer] and all updates done. Pt. approved to shop insurance coverage. Selected [employer] Cascade Gold, final conformation made, pay later and submitted. EHIP for premium payment, data form and final will be submitted.    Plan:  1. Assist pt. with medical coverage.  2. Assist pt. with premium payment.    DOCUMENT COMPOSED BY:   [name], MSW [phone] on  12/10/2021

## 2021-12-10 NOTE — Progress Notes (Signed)
DOCUMENT TYPE:    Social Work - Follow-up Note    PATIENT:    Dennis, [name]  MRN:    [id number]  DOB:    01-09-1991    ENCOUNTER DATE:  12/10/2021    ASSOCIATED PROGRAM-CLINIC:  [facility]    CONTACT TYPE:  Collateral    BRIEF DESCRIPTION:  Upload document    PATIENT DESCRIPTION, PRESENTING PROBLEM, PATIENT / FAMILY GOALS:  31 year old African American gay male with HIV, CD4 count 412, VL undetectable, on ART meds established HIV care and case management at [facility] clinic. Pt diagnosed with HIV on 03/21/2014 previous provider Dr. Staci Righter at [place], [place]. His family does not know about his diagnosis, but some his friends know. [name] lost job, on unemployment benefit, on [place], [place] and EIP coverage.    WHPF requested to upload household income verification, due date 02/12/2022.    INTERVENTION / OUTCOME / PLAN:  SWR uploaded WHPF requested unemployment benefit document, weekly income $13.    Plan:  1. Assist pt. with WHPF request.    DOCUMENT COMPOSED BY:   [name], MSW [phone] on  12/10/2021

## 2021-12-12 ENCOUNTER — Encounter (HOSPITAL_BASED_OUTPATIENT_CLINIC_OR_DEPARTMENT_OTHER): Payer: Self-pay | Admitting: Counselor

## 2021-12-12 NOTE — Progress Notes (Signed)
DOCUMENT TYPE:    Social Work - Follow-up Note    PATIENT:    Dennis [name], Dennis [name]  MRN:    [id number]  DOB:    March 20, 1990    ENCOUNTER DATE:  12/12/2021    ASSOCIATED PROGRAM-CLINIC:  [facility]    CONTACT TYPE:  Collateral    BRIEF DESCRIPTION:  EHIP form/EHIP verbal consent/ Final plan selection/ Data form submitted    PATIENT DESCRIPTION, PRESENTING PROBLEM, PATIENT / FAMILY GOALS:  31 year old African American gay male with HIV, CD4 count 412, VL undetectable, on ART meds established HIV care and case management at [facility] clinic. Pt diagnosed with HIV on 03/21/2014 previous provider Dr. Staci Righter at [place], [place]. His family does not know about his diagnosis, but some his friends know. [name] lost job, on unemployment benefit, on [place], [place] and EIP coverage.    Pt. is taking Descovy, this med is not going to be covered by [facility] for next year.    INTERVENTION / OUTCOME / PLAN:  Completed EHIP application for new insurance [employer] with premium, final plan selection, attached with EHIP verbal consent and data form submitted.    Plan:  1. Assist pt. with medical coverage.  2. Assist pt. with EHIP for premium payment.    DOCUMENT COMPOSED BY:   [name], MSW [phone] on  12/12/2021

## 2022-02-04 ENCOUNTER — Other Ambulatory Visit (HOSPITAL_BASED_OUTPATIENT_CLINIC_OR_DEPARTMENT_OTHER): Payer: Self-pay | Admitting: Infectious Disease

## 2022-02-19 ENCOUNTER — Other Ambulatory Visit (HOSPITAL_BASED_OUTPATIENT_CLINIC_OR_DEPARTMENT_OTHER): Payer: Self-pay | Admitting: Infectious Disease

## 2022-02-19 DIAGNOSIS — B20 Human immunodeficiency virus [HIV] disease: Secondary | ICD-10-CM

## 2022-02-19 NOTE — Telephone Encounter (Signed)
The requested medication requires your authorization because it is outside of the RAC's protocols:     Descovy, dolutegravir  - last found BMP and CMP in 2021 (> a year ago)    If this medication is denied please have your staff inform the patient and schedule an appointment if necessary.

## 2022-02-20 MED ORDER — DESCOVY 200-25 MG OR TABS
1.0000 | ORAL_TABLET | Freq: Every day | ORAL | 5 refills | Status: DC
Start: 2022-02-20 — End: 2022-05-27

## 2022-02-20 MED ORDER — TIVICAY 50 MG OR TABS
ORAL_TABLET | ORAL | 0 refills | Status: DC
Start: 2022-02-20 — End: 2022-03-13

## 2022-02-21 ENCOUNTER — Encounter (HOSPITAL_BASED_OUTPATIENT_CLINIC_OR_DEPARTMENT_OTHER): Payer: Self-pay | Admitting: Counselor

## 2022-02-21 NOTE — Progress Notes (Signed)
DOCUMENT TYPE:    Social Work - Follow-up Note    PATIENT:    Dennis [name], Dennis [name]  MRN:    [id number]  DOB:    09-07-1990    ENCOUNTER DATE:  02/20/2022    ASSOCIATED PROGRAM-CLINIC:  [facility]    CONTACT TYPE:  Telephone    BRIEF DESCRIPTION:  LDP enrollment / Referral made in Provide/ Faxed to LDP    PATIENT DESCRIPTION, PRESENTING PROBLEM, PATIENT / FAMILY GOALS:  32 year old African American gay male with HIV, CD4 count 412, VL undetectable, on ART meds established HIV care and case management at [facility] clinic. Pt diagnosed with HIV on 03/21/2014 previous provider Dr. Scharlene Gloss at [place], [place]. His family does not know about his diagnosis, but some his friends know. [name] lost job, on unemployment benefit, on [employer], [place] and EIP coverage.    Pt. called c/o tooth pain with cavities, asked urgent dental need.    INTERVENTION / OUTCOME / PLAN:  SWR communicated with Anthonny about LDP referral through Provide and forms to be completed. Pt. e-mailed unemployment benefit verification documents, receives $585/wk. Writer completed LDP enrollment form, Agreement to obtain and release information, HIV verification form, verification of income statement and LDP verbal consent form. SWR made referral through Provide, scanned and uploaded to Provide. Writer also faxed these documents to LDP and received confirmation. SWR commented on fax re: urgent dental need and expedite the request.    Plan:  1. Assist pt. with urgent dental need.    DOCUMENT COMPOSED BY:   Nicholes Calamity, MSW [phone] on  02/21/2022

## 2022-02-21 NOTE — Progress Notes (Signed)
DOCUMENT TYPE:    Social Work - Follow-up Note    PATIENT:    [name], Dennis  MRN:    [id number]  DOB:    [birth year]    ENCOUNTER DATE:  02/20/2022    ASSOCIATED PROGRAM-CLINIC:  [facility]    CONTACT TYPE:  Collateral    BRIEF DESCRIPTION:  RE: Made call to LDP and e-mailed LDP for urgent dental appt.    PATIENT DESCRIPTION, PRESENTING PROBLEM, PATIENT / FAMILY GOALS:  32 year old African American gay male with HIV, CD4 count 412, VL undetectable, on ART meds established HIV care and case management at [facility] clinic. Pt diagnosed with HIV on 03/21/2014 previous provider Dr. Scharlene Gloss at [place], [place]. His family does not know about his diagnosis, but some his friends know. [name] lost job, on unemployment benefit, on [place], [place] and EIP coverage.    RE: urgent dental referral    INTERVENTION / OUTCOME / PLAN:  After LDP referral completed in provide and faxed to LDP desk writer made a f/u call. Writer found out that they are out on Wednesday. SWR sent e-mail to [facility] coordinator re: faxed documents and urgent dental appt.    Plan:  1. Assist pt. with urgent dental appt.    DOCUMENT COMPOSED BY:   Nicholes Calamity, MSW [phone] on  02/21/2022

## 2022-02-22 ENCOUNTER — Encounter (HOSPITAL_BASED_OUTPATIENT_CLINIC_OR_DEPARTMENT_OTHER): Payer: Self-pay | Admitting: Counselor

## 2022-02-22 NOTE — Progress Notes (Signed)
DOCUMENT TYPE:    Social Work - Follow-up Note    PATIENT:    Dennis [name], Dennis [name]  MRN:    [id number]  DOB:    30-Oct-1990    ENCOUNTER DATE:  02/22/2022    ASSOCIATED PROGRAM-CLINIC:  [facility]    CONTACT TYPE:  Collateral    BRIEF DESCRIPTION:  Charity care    PATIENT DESCRIPTION, PRESENTING PROBLEM, PATIENT / FAMILY GOALS:  32 year old African American gay male with HIV, CD4 count 412, VL undetectable, on ART meds established HIV care and case management at [facility] clinic. Pt diagnosed with HIV on 03/21/2014 previous provider Dr. Scharlene Gloss at [place], [place]. His family does not know about his diagnosis, but some his friends know. [name] lost job, on unemployment benefit, on [place], [place] and EIP coverage.    INTERVENTION / OUTCOME / PLAN:  SW completed charity care application, attached statement of unemployment benefit and submitted.    Plan:  1. Assist pt. with charity care application.    DOCUMENT COMPOSED BY:   Nicholes Calamity, MSW [phone] on  02/22/2022

## 2022-02-22 NOTE — Progress Notes (Signed)
DOCUMENT TYPE:    Social Work - Follow-up Note    PATIENT:    Dennis [name], Dennis [name]  MRN:    [id number]  DOB:    March 20, 1990    ENCOUNTER DATE:  02/21/2022    ASSOCIATED PROGRAM-CLINIC:  [facility]    CONTACT TYPE:  Collateral    BRIEF DESCRIPTION:  RE: LDP Dental appt    PATIENT DESCRIPTION, PRESENTING PROBLEM, PATIENT / FAMILY GOALS:  32 year old African American gay male with HIV, CD4 count 54, VL undetectable, on ART meds established HIV care and case management at [facility] clinic. Pt diagnosed with HIV on 03/21/2014 previous provider Dr. Scharlene Gloss at [place], [place]. His family does not know about his diagnosis, but some his friends know. [name] lost job, on unemployment benefit, on [place], [place] and EIP coverage.    INTERVENTION / OUTCOME / PLAN:  SWR communicated with [place] and [name] re: urgent dental need. Writer received e-mail from [name] about dental appt. [name] scheduled to see dentist at [facility]- on 02/22/2022 at 4:00pm. SWR responded to this email.    Plan:  1. Assist pt. with dental care    DOCUMENT COMPOSED BY:   Nicholes Calamity, MSW [phone] on  02/22/2022

## 2022-02-22 NOTE — Progress Notes (Signed)
DOCUMENT TYPE:    Social Work - Follow-up Note    PATIENT:    Dennis [name], Dennis [name]  MRN:    [id number]  DOB:    1990-10-05    ENCOUNTER DATE:  02/21/2022    ASSOCIATED PROGRAM-CLINIC:  [facility]    CONTACT TYPE:  Telephone    BRIEF DESCRIPTION:  Re: Urgent dental appt/ Vision-Eye care    PATIENT DESCRIPTION, PRESENTING PROBLEM, PATIENT / FAMILY GOALS:  32 year old African American gay male with HIV, CD4 count 412, VL undetectable, on ART meds established HIV care and case management at [facility] clinic. Pt diagnosed with HIV on 03/21/2014 previous provider Dr. Scharlene Gloss at [place], [place]. His family does not know about his diagnosis, but some his friends know. [name] lost job, on unemployment benefit, on  of [place], [place] and EIP coverage.    Pt. requested assistance for vision care/ Eye appt. Pt. has no charity coverage.    INTERVENTION / OUTCOME / PLAN:  SWR called pt. to communicate urgent dental appt. for tomorrow made by [facility] LDP coordinator. [name] scheduled for his first dental appointment at [facility]) for tomorrow, Friday, 02-22-2022 at 04:00 PM (Check-in 03:30 PM to fill out paperwork).    [facility]  [facility])  [place] [address]  [place]: [phone]  [place]: [phone]    Pt. enrolled into the Dental Program is currently effective from 10-14-2021 through 10-14-2022.  Provided [name] with CD4 count and VL from his last lab result. Reminded pt. to take pic of his meds to show at dentist. Pt. asked assistance with eye clinic appt. QHP cannot cover eye care and explained to pt. that he has to complete charity care application. [name] gave verbal consent to submit charity care application.      Plan:  1. Assist pt. with urgent dental care.  2. Assist pt. with eye care/ Vision care.    DOCUMENT COMPOSED BY:   Nicholes Calamity, MSW [phone] on  02/22/2022

## 2022-03-11 ENCOUNTER — Ambulatory Visit (HOSPITAL_COMMUNITY): Payer: Self-pay

## 2022-03-11 ENCOUNTER — Ambulatory Visit: Payer: No Typology Code available for payment source | Attending: Infectious Disease

## 2022-03-11 ENCOUNTER — Encounter (HOSPITAL_BASED_OUTPATIENT_CLINIC_OR_DEPARTMENT_OTHER): Payer: Self-pay | Admitting: Infectious Disease

## 2022-03-11 ENCOUNTER — Encounter (HOSPITAL_BASED_OUTPATIENT_CLINIC_OR_DEPARTMENT_OTHER): Payer: Self-pay

## 2022-03-11 ENCOUNTER — Telehealth (HOSPITAL_BASED_OUTPATIENT_CLINIC_OR_DEPARTMENT_OTHER): Payer: Self-pay | Admitting: Infectious Disease

## 2022-03-11 ENCOUNTER — Ambulatory Visit (HOSPITAL_BASED_OUTPATIENT_CLINIC_OR_DEPARTMENT_OTHER): Payer: No Typology Code available for payment source | Admitting: Infectious Disease

## 2022-03-11 ENCOUNTER — Other Ambulatory Visit (HOSPITAL_BASED_OUTPATIENT_CLINIC_OR_DEPARTMENT_OTHER): Payer: 59 | Admitting: Infectious Disease

## 2022-03-11 DIAGNOSIS — D708 Other neutropenia: Secondary | ICD-10-CM | POA: Insufficient documentation

## 2022-03-11 DIAGNOSIS — B20 Human immunodeficiency virus [HIV] disease: Secondary | ICD-10-CM | POA: Insufficient documentation

## 2022-03-11 LAB — CBC, DIFF
% Basophils: 0 %
% Eosinophils: 2 %
% Immature Granulocytes: 0 %
% Lymphocytes: 57 %
% Monocytes: 15 %
% Neutrophils: 26 %
% Nucleated RBC: 0 %
Absolute Eosinophil Count: 0.05 10*3/uL (ref 0.00–0.50)
Absolute Lymphocyte Count: 1.91 10*3/uL (ref 1.00–4.80)
Basophils: 0.01 10*3/uL (ref 0.00–0.20)
Hematocrit: 36 % — ABNORMAL LOW (ref 38.0–50.0)
Hemoglobin: 12.7 g/dL — ABNORMAL LOW (ref 13.0–18.0)
Immature Granulocytes: 0 10*3/uL (ref 0.00–0.05)
MCH: 29.7 pg (ref 27.3–33.6)
MCHC: 35.7 g/dL (ref 32.2–36.5)
MCV: 83 fL (ref 81–98)
Monocytes: 0.52 10*3/uL (ref 0.00–0.80)
Neutrophils: 0.88 10*3/uL — ABNORMAL LOW (ref 1.80–7.00)
Nucleated RBC: 0 10*3/uL
Platelet Count: 215 10*3/uL (ref 150–400)
RBC: 4.27 10*6/uL — ABNORMAL LOW (ref 4.40–5.60)
RDW-CV: 13.7 % (ref 11.0–14.5)
WBC: 3.37 10*3/uL — ABNORMAL LOW (ref 4.3–10.0)

## 2022-03-11 LAB — COMPREHENSIVE METABOLIC PANEL
ALT (GPT): 17 U/L (ref 10–64)
AST (GOT): 16 U/L (ref 9–38)
Albumin: 4.1 g/dL (ref 3.5–5.2)
Alkaline Phosphatase (Total): 136 U/L — ABNORMAL HIGH (ref 35–109)
Anion Gap: 6 (ref 4–12)
Bilirubin (Total): 0.5 mg/dL (ref 0.2–1.3)
Calcium: 9.1 mg/dL (ref 8.9–10.2)
Carbon Dioxide, Total: 29 meq/L (ref 22–32)
Chloride: 104 meq/L (ref 98–108)
Creatinine: 1.2 mg/dL — ABNORMAL HIGH (ref 0.51–1.18)
Glucose: 83 mg/dL (ref 62–125)
Potassium: 4.2 meq/L (ref 3.6–5.2)
Protein (Total): 7.3 g/dL (ref 6.0–8.2)
Sodium: 139 meq/L (ref 135–145)
Urea Nitrogen: 12 mg/dL (ref 8–21)
eGFR by CKD-EPI 2021: >60 mL/min/{1.73_m2} (ref >59)

## 2022-03-11 LAB — LAB UNDEFINED ORCA/EPIC ORDER

## 2022-03-11 LAB — HEPATITIS C AB WITH REFLEX PCR: Hepatitis C Antibody w/Rflx PCR: NONREACTIVE

## 2022-03-11 LAB — HIV1 RNA QUANTITATION

## 2022-03-11 LAB — FLOW CYTOMETRY PANEL

## 2022-03-11 NOTE — Telephone Encounter (Signed)
RETURN CALL: Voicemail - Detailed Message      SUBJECT:  General Message     MESSAGE: Patient calling because he is running late for his 3:30PM appointment, due to a bus not showing as scheduled. Patient states he is on his way and should be there within the next 10 minutes. CCR attempted warm transfer to front desk; clinic was assisting other patients.

## 2022-03-11 NOTE — Progress Notes (Unsigned)
[facility]  FOLLOW-UP VISIT  03/11/2022    ID/CC: Dennis [name] is a 32 year old male who returns ***    ASSESSMENT/PLAN:   There are no diagnoses linked to this encounter.     Return to clinic/follow-up: ***  _____________________________  HISTORY OF PRESENT ILLNESS:   ***   Lost job?  Neutropenia work-up: spleen not enlarged, needs bloodwork done    PHYSICAL EXAMINATION:   Vital Signs: There were no vitals taken for this visit.  {comprehensive exam:110854}    {History SmartLinks - complete If you wish to pull any of the patient's Epic history into the note (Optional):111132}    HIV General Healthcare Maintenance  Cardiovascular/metabolic   Lipids: nl in 123XX123, 10/2017  BP: nl screens at last 4 visits  Glucose control: A1c nl 07/2017  UA: microhematuria in 07/2017 --> resolved on repeat UA 10/2017. Trace proteinuria 01/2019. Normal exam 08/2019     Sexual health and STI screening  Syphilis: serofast RPR 1:4 in 02/2017 after tx for early syphilis in 01/2016 (initial RPR 1:32) --> 1:1 in 10/2017 (likely new serofast titer) --> 1:1 in 03/2018, 08/2018, 01/2019, 08/2019 --> 1:4 in 01/2020 (treated for EL syphilis with BIC x1) --> back down to serofast titer 1:1 in 04/2020 --> NR in 01/2021, 03/2021  GC/CT: neg screen x3 in 10/2017, 08/2018, 01/2020, 05/2020 (then throat neg 06/2020 - tested at time of sore throat). Last screen neg x3 sites in 03/2021  Sexual pleasure: MSM, vers. Able to derive pleasure from sex, although sex has never been the focus of his relationships. No ED or libido changes associated with duloxetine use.   HPV status: declined series  HSV status: no known outbreaks     Cancer screening  Lung: N/A (never tobacco user)  Anal: address as needed at age 51    Immunizations/Serologies and misc testing  Declines flu, COVID bivalent booster, Menveo, TDaP and HPV vaccines   Quantiferon neg 01/2016  Toxo neg 01/2016  HAV immune 01/2016  HBsAb pos (titer 82), sAg neg, cAb neg 01/2016  HCV NR 04/2020    Immunization  History   Administered Date(s) Administered    COVID-19 Pfizer mRNA monovalent 12 yrs and older (purple cap) 01/28/2019, 02/17/2019    COVID-19 Pfizer mRNA monovalent tris-sucrose 12 yrs and older (gray cap) 06/21/2020    Influenza quadrivalent PF 02/08/2016, 10/10/2016    Mpox/smallpox (vaccinia) live 09/03/2020, 10/07/2020    Pneumococcal conjugate PCV13 (Prevnar 13) 05/02/2016    Pneumococcal polysaccharide PPSV23 (Pneumovax 23) 10/10/2016

## 2022-03-11 NOTE — Progress Notes (Signed)
Nurse Visit/Clinical Support:    The patient, Dennis [name]  , arrived to the clinic today for labs .     I verified the patient's full name and date of birth at the start of this visit.    [name] missed his appointment with Dr. Celine Ahr today (bus issues). Dr. Celine Ahr ordered labs. Pt completed STI testing at the clinic and will go down to the lab for blood draw. He is considering getting a new provider who is here on other days of the week. VSS. No further questions or concerns.    The patient was in agreement of today's plan of care and will follow-up as necessary.    Rojelio Brenner, RN  03/11/2022

## 2022-03-12 LAB — GC&CHLAM NUCLEIC ACID DETECTN
Chlam Trachomatis Nucleic Acid: NEGATIVE
Chlam Trachomatis Nucleic Acid: NEGATIVE
Chlam Trachomatis Nucleic Acid: NEGATIVE
N.Gonorrhoeae(GC) Nucleic Acid: NEGATIVE
N.Gonorrhoeae(GC) Nucleic Acid: NEGATIVE
N.Gonorrhoeae(GC) Nucleic Acid: NEGATIVE

## 2022-03-12 LAB — FLOW CYTOMETRY PANEL

## 2022-03-12 LAB — HIV1 RNA QUANTITATION

## 2022-03-12 LAB — PATHOLOGISTS REVIEW

## 2022-03-13 ENCOUNTER — Telehealth (HOSPITAL_BASED_OUTPATIENT_CLINIC_OR_DEPARTMENT_OTHER): Payer: Self-pay | Admitting: Infectious Disease

## 2022-03-13 DIAGNOSIS — B20 Human immunodeficiency virus [HIV] disease: Secondary | ICD-10-CM

## 2022-03-13 DIAGNOSIS — F4322 Adjustment disorder with anxiety: Secondary | ICD-10-CM

## 2022-03-13 MED ORDER — DOLUTEGRAVIR SODIUM 50 MG OR TABS
ORAL_TABLET | ORAL | 5 refills | Status: DC
Start: 2022-03-13 — End: 2022-05-27

## 2022-03-13 MED ORDER — DULOXETINE HCL 60 MG OR CPEP
60.0000 mg | DELAYED_RELEASE_CAPSULE | Freq: Every day | ORAL | 5 refills | Status: DC
Start: 2022-03-13 — End: 2022-05-27

## 2022-03-13 NOTE — Telephone Encounter (Signed)
Called [name] to check in and explain current lab tests results and those that are pending. He has found another job that gives a paycheck but still looking for something longer term. He will start taking Pure for Men fiber supplement 3 tabs bid and is working out again without issues with stamina. No issues with supplements and ART. All questions answered to his satisfaction. We will check in May 13 at next appt.

## 2022-03-14 LAB — FLOW CYTOMETRY PANEL

## 2022-03-15 LAB — URD SINGLE AG PHENOTYPE (1ST) (SENDOUT)

## 2022-03-15 LAB — SINGLE AG PHENOTYPE (2ND) (SENDOUT)

## 2022-03-15 LAB — RPR QUANTITATIVE (SENDOUT): RPR Quantitative (Sendout): NONREACTIVE

## 2022-03-15 NOTE — Progress Notes (Signed)
Pt came late for appt and elected not to stay to wait to be seen. I checked in him via phone -- see those notes for details.

## 2022-03-21 ENCOUNTER — Encounter (HOSPITAL_BASED_OUTPATIENT_CLINIC_OR_DEPARTMENT_OTHER): Payer: Self-pay | Admitting: Counselor

## 2022-03-21 ENCOUNTER — Other Ambulatory Visit (HOSPITAL_BASED_OUTPATIENT_CLINIC_OR_DEPARTMENT_OTHER): Payer: Self-pay | Admitting: Infectious Disease

## 2022-03-21 DIAGNOSIS — Z0100 Encounter for examination of eyes and vision without abnormal findings: Secondary | ICD-10-CM

## 2022-03-21 NOTE — Progress Notes (Signed)
DOCUMENT TYPE:    Social Work - Follow-up Note    PATIENT:    Dennis [name], Dennis [name]  MRN:    [id number]  DOB:    05/24/1990    ENCOUNTER DATE:  03/20/2022    ASSOCIATED PROGRAM-CLINIC:  [facility]    CONTACT TYPE:  Telephone    BRIEF DESCRIPTION:  RE: Dental and Eye care/ Service plan    PATIENT DESCRIPTION, PRESENTING PROBLEM, PATIENT / FAMILY GOALS:  32 year old African American gay male with HIV, CD4 count 58, VL undetectable, on ART meds established HIV care and case management at [facility] clinic. Pt diagnosed with HIV on 03/21/2014 previous provider Dr. Scharlene Gloss at [place], [place]. His family does not know about his diagnosis, but some his friends know. Dennis [name] lost job, on unemployment benefit, on [place], [place] and EIP coverage. Charity care approved.    Pt. called to ask about dental clinic and vision care.    INTERVENTION / OUTCOME / PLAN:  SWR communicated with pt. about dental care and vision care. Pt. said he was referred to dental specialty clinic for crown. He said crown was done and told to go back to first dental clinic and asked about it. Writer explained to [place] that after he finished his dental work he has to call to previous dental clinic for f/u dental care. Dennis [name] said he needs eye exam and eyeglasses. Educated pt. about vision care coverage through charity care and referral to eye clinic. SWR sent EPIC message to Dr. Hester Mates re: eye clinic referral.    Dennis [name] through service plan with pt. and completed. Pt. lives in stable house and receives unemployment benefit. Dennis [name] said he is looking for a job. He is on medical coverage through [place], EIP and charity coverage. Pt. is taking HAART meds regularly. Pt. receives dental care through [facility].    Plan:  1. Assist pt. with dental care.  2. Assist pt. with [place] care  3. Support pt. with adherence to HAART meds.    DOCUMENT COMPOSED BY:   Nicholes Calamity, MSW [phone] on  03/21/2022

## 2022-03-21 NOTE — Progress Notes (Signed)
Pt requested referral to optometry for routine vision exam/updated glasses Rx.

## 2022-03-25 ENCOUNTER — Encounter (HOSPITAL_BASED_OUTPATIENT_CLINIC_OR_DEPARTMENT_OTHER): Payer: Self-pay | Admitting: Counselor

## 2022-03-25 NOTE — Progress Notes (Signed)
DOCUMENT TYPE:    Social Work - Follow-up Note    PATIENT:    Dennis [name], Dennis [name]  MRN:    [id number]  DOB:    [birth year]    ENCOUNTER DATE:  03/22/2022    ASSOCIATED PROGRAM-CLINIC:  [facility]    CONTACT TYPE:  Telephone    BRIEF DESCRIPTION:  Dental appt    PATIENT DESCRIPTION, PRESENTING PROBLEM, PATIENT / FAMILY GOALS:  32 year old African American gay male with HIV, CD4 count 412, VL undetectable, on ART meds established HIV care and case management at [facility] clinic. Pt diagnosed with HIV on 03/21/2014 previous provider Dr. Scharlene Gloss at [place], [place]. His family does not know about his diagnosis, but some his friends know. [name] lost job, on unemployment benefit, on [place], [place] and EIP coverage. Charity care approved.    INTERVENTION / OUTCOME / PLAN:  [name] was contacted by [employer] for specials dental care- Crown. Pt. schedule an appointment on March 21st,2024 at 9:30am. Address to this clinic provided.    Plan:  1. Assist pt. with dental care    DOCUMENT COMPOSED BY:   Nicholes Calamity, MSW [phone] on  03/25/2022

## 2022-03-25 NOTE — Progress Notes (Signed)
DOCUMENT TYPE:    Social Work - Follow-up Note    PATIENT:    Dennis [name], Dennis [name]  MRN:    [id number]  DOB:    1990-06-12    ENCOUNTER DATE:  03/21/2022    ASSOCIATED PROGRAM-CLINIC:  [facility]    CONTACT TYPE:  Collateral    BRIEF DESCRIPTION:  Dental specialist referral    PATIENT DESCRIPTION, PRESENTING PROBLEM, PATIENT / FAMILY GOALS:  32 year old African American gay male with HIV, CD4 count 26, VL undetectable, on ART meds established HIV care and case management at [facility] clinic. Pt diagnosed with HIV on 03/21/2014 previous provider Dr. Scharlene Gloss at [place], [place]. His family does not know about his diagnosis, but some his friends know. [name] lost job, on unemployment benefit, on [place], [place] and EIP coverage. Charity care approved.    INTERVENTION / OUTCOME / PLAN:  SWR received an email re: Specialist Referral for a crown processed to [facility] and pt. will receive call in 3 days.    [facility]  [address], [place] [address]    [place]: Entrance is street level at [place].  Parking entrance on [address]; After parking, go to level A in the parking garage & exit to [place] to find [employer] entrance.  [place]: [phone]  F. [phone]    Plan:  1. Contact pt. about the above referral.    DOCUMENT COMPOSED BY:   Nicholes Calamity, MSW [phone] on  03/25/2022

## 2022-03-26 ENCOUNTER — Encounter (HOSPITAL_BASED_OUTPATIENT_CLINIC_OR_DEPARTMENT_OTHER): Payer: Self-pay | Admitting: Infectious Disease

## 2022-03-26 DIAGNOSIS — L988 Other specified disorders of the skin and subcutaneous tissue: Secondary | ICD-10-CM

## 2022-04-03 ENCOUNTER — Telehealth (HOSPITAL_BASED_OUTPATIENT_CLINIC_OR_DEPARTMENT_OTHER): Payer: Self-pay

## 2022-04-03 NOTE — Telephone Encounter (Signed)
RETURN CALL: Voicemail - Detailed Message      SUBJECT:  Appointment Request     REASON FOR VISIT: Per Referral - Vision Test  PREFERRED DATE/TIME: Today  REASON UNABLE TO APPOINT: [facility] unable to appoint. Pt has a referral to the clinic but services not provided per PST. Pt would like a call back for availability.

## 2022-04-03 NOTE — Telephone Encounter (Signed)
Called and scheduled pt,      Thank you!

## 2022-04-11 ENCOUNTER — Encounter (HOSPITAL_BASED_OUTPATIENT_CLINIC_OR_DEPARTMENT_OTHER): Payer: Self-pay | Admitting: Infectious Disease

## 2022-04-11 DIAGNOSIS — F4322 Adjustment disorder with anxiety: Secondary | ICD-10-CM

## 2022-04-11 DIAGNOSIS — B20 Human immunodeficiency virus [HIV] disease: Secondary | ICD-10-CM

## 2022-04-15 MED ORDER — DOLUTEGRAVIR SODIUM 50 MG OR TABS
50.0000 mg | ORAL_TABLET | Freq: Every day | ORAL | 0 refills | Status: DC
Start: 2022-04-15 — End: 2022-04-16

## 2022-04-15 MED ORDER — DULOXETINE HCL 60 MG OR CPEP
60.0000 mg | DELAYED_RELEASE_CAPSULE | Freq: Every day | ORAL | 0 refills | Status: DC
Start: 2022-04-15 — End: 2022-04-16

## 2022-04-15 MED ORDER — DESCOVY 200-25 MG OR TABS
1.0000 | ORAL_TABLET | Freq: Every day | ORAL | 0 refills | Status: DC
Start: 2022-04-15 — End: 2022-04-16

## 2022-04-15 NOTE — Telephone Encounter (Signed)
Sent 30-day supply of ARVs and duloxetine to pharmacy in [place].  Duplicate orders will fall off med list on 05/15/2022.

## 2022-04-22 ENCOUNTER — Encounter (HOSPITAL_BASED_OUTPATIENT_CLINIC_OR_DEPARTMENT_OTHER): Payer: Self-pay | Admitting: Counselor

## 2022-04-22 NOTE — Progress Notes (Signed)
DOCUMENT TYPE:    Social Work - Follow-up Note    PATIENT:    Dennis [name], Dennis [name]  MRN:    [id number]  DOB:    1990/01/28    ENCOUNTER DATE:  04/22/2022    ASSOCIATED PROGRAM-CLINIC:  [facility]    CONTACT TYPE:  Telephone    BRIEF DESCRIPTION:  RE: phone call    PATIENT DESCRIPTION, PRESENTING PROBLEM, PATIENT / FAMILY GOALS:  32 year old African American gay male with HIV, CD4 count 412, VL undetectable, on ART meds established HIV care and case management at [facility] clinic. Pt diagnosed with HIV on 03/21/2014 previous provider Dr. Staci Righter at [place], [place]. His family does not know about his diagnosis, but some his friends know. [name] lost job, on unemployment benefit, on [place], [place] and EIP coverage. Charity care approved.    INTERVENTION / OUTCOME / PLAN:  SWR responded to pt's call about eye prescription and left message on VM. Will make f/u call.    Plan:  1. Assist pt. with medical care and coverage.    DOCUMENT COMPOSED BY:   [name], MSW [phone] on  04/22/2022

## 2022-05-09 ENCOUNTER — Ambulatory Visit: Payer: Self-pay | Attending: Infectious Disease | Admitting: Optometrist

## 2022-05-09 DIAGNOSIS — H52203 Unspecified astigmatism, bilateral: Secondary | ICD-10-CM

## 2022-05-09 DIAGNOSIS — H5213 Myopia, bilateral: Secondary | ICD-10-CM

## 2022-05-09 DIAGNOSIS — R7303 Prediabetes: Secondary | ICD-10-CM

## 2022-05-09 DIAGNOSIS — B20 Human immunodeficiency virus [HIV] disease: Secondary | ICD-10-CM

## 2022-05-09 NOTE — Progress Notes (Signed)
Chief Complaint   Patient presents with    Eye Exam     Pt is here for prediabetes eye exam. Exercise and diet controlled. Denies floaters and flashes. No eye discomfort. Vision is clear.     All history obtained by interpreter today? NO      HPI:  Patient presents as New patient to [facility].  Patient reports no eye pain  Pt is  wearing glasses.     Past Ocular Hx:  Hx Ocular disorders: None  Hx Ocular surgeries: None  Ocular Meds: None    Medical History:  HIV(+)  Pre-diabetes reported by pt    Current Medications:   Current Outpatient Medications   Medication Sig Dispense Refill    dolutegravir (Tivicay) 50 MG tablet Take 1 tablet  by mouth daily 30 tablet 5    DULoxetine 60 MG DR capsule Take 1 capsule (60 mg) by mouth daily. 30 capsule 5    emtricitabine-tenofovir alafenamide (Descovy) 200-25 MG tablet Take 1 tablet by mouth daily. 30 tablet 5    triamcinolone 0.1 % ointment Apply topically 2 times a day. Apply to R elbow twice a day PRN. 30 g 2     No current facility-administered medications for this visit.       Allergies:   Review of patient's allergies indicates:  No Known Allergies    Family Ocular History:  Unremarkable    "Today's intake form reviewed and confirmed with the patient, scanned if relevant."    Assessments and Plans:    1. Myopia of both eyes with astigmatism  New spectacle Rx finalized.   - Referral to Optometry    2. HIV disease (HCC)  Latest viral not detected. CD4 551 10/2020.   No signs of HIV eye diseases. [place]     3. Pre-diabetes  No signs of diabetic retinopathy. Patient educated regarding importance of continued blood sugar control. Letter to PCP regarding today's exam.     RTC 1-2 years Comprehensive dilated eye exam.           See After Visit Summary for additional overview/instructions      Thank you for coming in today.   During today's visit we reviewed only your ophthalmology (eye-related) medications.   Please follow up with your primary care provider for any  questions regarding other medications.   If your eyes were dilated during today's visit, the average dilation will last 4 to 6 hours and may impact your overall vision during this period. Please use caution during this period.   If you need to schedule or change a follow up appointment, please call [phone].   Our phone lines are open 7:00am to 8:00pm Monday through Saturdays and 9:00am to 5:30pm on Sundays.      If you have any questions regarding contact lens, please call [facility] at [phone] ext [phone].

## 2022-05-09 NOTE — Patient Instructions (Signed)
Thank you for coming in today.  During today's visit we reviewed only your ophthalmology (eye-related) medications.  Please follow up with your primary care provider for any questions regarding other medications.    If your eyes were dilated during today's visit, the average dilation will last 4 to 6 hours and may impact your overall vision during this period. Please use caution during this period.    If you need to schedule or change a follow up appointment, please call [phone].  Our phone lines are open Mon-Fri 7am-7pm & Saturdays 8am-4:30pm.

## 2022-05-27 ENCOUNTER — Ambulatory Visit: Payer: No Typology Code available for payment source | Attending: Infectious Disease | Admitting: Infectious Disease

## 2022-05-27 VITALS — BP 124/73 | HR 68 | Temp 98.6°F | Wt 181.6 lb

## 2022-05-27 DIAGNOSIS — B20 Human immunodeficiency virus [HIV] disease: Secondary | ICD-10-CM | POA: Insufficient documentation

## 2022-05-27 DIAGNOSIS — D708 Other neutropenia: Secondary | ICD-10-CM | POA: Insufficient documentation

## 2022-05-27 DIAGNOSIS — F4322 Adjustment disorder with anxiety: Secondary | ICD-10-CM | POA: Insufficient documentation

## 2022-05-27 MED ORDER — DULOXETINE HCL 60 MG OR CPEP
60.0000 mg | DELAYED_RELEASE_CAPSULE | Freq: Every day | ORAL | 5 refills | Status: DC
Start: 2022-05-27 — End: 2023-01-21

## 2022-05-27 MED ORDER — DOLUTEGRAVIR SODIUM 50 MG OR TABS
ORAL_TABLET | ORAL | 5 refills | Status: DC
Start: 2022-05-27 — End: 2023-01-21

## 2022-05-27 MED ORDER — DESCOVY 200-25 MG OR TABS
1.0000 | ORAL_TABLET | Freq: Every day | ORAL | 5 refills | Status: DC
Start: 2022-05-27 — End: 2023-01-21

## 2022-05-27 NOTE — Progress Notes (Signed)
[facility]  FOLLOW-UP VISIT  05/27/2022    ID/CC: Dennis [name] is a 32 year old man with well-controlled HIV here for routine care visit.    ASSESSMENT/PLAN:   1. HIV disease (HCC)  Longstanding excellent adherence to DTG + F/TAF, last CD4 >500 in 10/2020, VL UD in 02/2022. Labs next due later in the year. Has standing orders for q69mo monitoring per his request.    - Hemoglobin A1C, HPLC; Future  - Lipid Panel; Future  - HIV1 Rna Quantitation; Future  - dolutegravir (Tivicay) 50 MG tablet; Take 1 tablet  by mouth daily  Dispense: 30 tablet; Refill: 5  - emtricitabine-tenofovir alafenamide (Descovy) 200-25 MG tablet; Take 1 tablet by mouth daily.  Dispense: 30 tablet; Refill: 5    2. Adjustment disorder with anxiety  Continue same dose. He will let me know if he wants to taper off to discontinue or escalate dose depending on sx.   - DULoxetine 60 MG DR capsule; Take 1 capsule (60 mg) by mouth daily.  Dispense: 30 capsule; Refill: 5    3. Other neutropenia (HCC)  Confirmed Duffy null phenotype; clinically insignificant. Last ANC 880 in 02/2022 -- will CTM every year or so to ensure >500.    Return to clinic/follow-up: 3 months    I spent a total of 25 minutes for the patient's care on the date of the service.      _____________________________  HISTORY OF PRESENT ILLNESS:   Dennis [name] is feeling well overall. Still looking for another job. Went to the optometrist and was given another Rx for glasses but not contacts for some reason. They did not find any signs of HIV-related eye dz as expected. Mood is stable, still finds pleasure in doing some things, prefers to avoid overwhelming social situations -- he is ok to continue on current dose of duloxetine. There was a small hiccup with him not getting ART filled before he went on a trip to [place]; ultimately ended up being off meds for 4d then resumed promptly on his return and hasn't missed any doses since. Not currently dating or very sexually active by choice.      HIV HISTORY:  HIV PCP: [name] --> [facility]man  Case Manager: [name]  Diagnosis Date: 03/2014 in [place]  Risk Factors for HIV: MSM  CD4 Nadir: Unk, CD4 404 in 05/2016  ARV/Resistance History: Unk resistance. Started Genvoya at dx --> DTG + FTC/TAC in 06/2016  HIV-Related Illnesses: None known  Viral Hepatitis: HAV immune 01/2016; HBsAb pos, sAg neg, cAb neg 01/2016; HCV NR 02/2017    PHYSICAL EXAMINATION:   Vital Signs: BP 124/73   Pulse 68   Temp 37 C (Temporal)   Wt 82.4 kg (181 lb 9.6 oz)   SpO2 97%   BMI 25.33 kg/m   Constitutional: Well-appearing, well-groomed, in no distress  Neuro: A&Ox3, normal gait  Psych: Calm and not depressed, congruent affect, good insight    HIV General Healthcare Maintenance  Cardiovascular/metabolic   Lipids: nl in 10/2017  BP: nl screens at last 4 visits  Glucose control: A1c nl 07/2017  UA: microhematuria in 07/2017 --> resolved on repeat UA 10/2017. Trace proteinuria 01/2019. Normal exam 08/2019     Sexual health and STI screening  Syphilis: serofast RPR 1:4 in 02/2017 after tx for early syphilis in 01/2016 (initial RPR 1:32) --> 1:1 in 10/2017 (likely new serofast titer) --> 1:1 in 03/2018, 08/2018, 01/2019, 08/2019 --> 1:4 in 01/2020 (treated for EL syphilis with  BIC x1) --> NR in 02/2022  GC/CT: neg screen x3 in 10/2017, 08/2018, 01/2020, 05/2020 (then throat neg 06/2020 - tested at time of sore throat). Last screen neg x3 sites in 02/2022  Sexual pleasure: MSM, vers. Able to derive pleasure from sex, although sex has never been the focus of his relationships. No ED or libido changes associated with duloxetine use.   HPV status: declined series  HSV status: no known outbreaks     Cancer screening  Lung: N/A (never tobacco user)  Anal: address as needed at age 67     Immunizations/Serologies and misc testing  Declines flu, COVID bivalent booster, Menveo, TDaP and HPV vaccines   Quantiferon neg 01/2016  Toxo neg 01/2016  HAV immune 01/2016  HBsAb pos (titer 82), sAg neg, cAb neg 01/2016  HCV  NR 02/2022    Immunization History   Administered Date(s) Administered    COVID-19 Pfizer mRNA monovalent 12 yrs and older 01/28/2019, 02/17/2019    COVID-19 Pfizer mRNA monovalent tris-sucrose 12 yrs and older 06/21/2020    Influenza quadrivalent PF 02/08/2016, 10/10/2016    Mpox/smallpox (vaccinia) live 09/03/2020, 10/07/2020    Pneumococcal conjugate PCV13 (Prevnar 13) 05/02/2016    Pneumococcal polysaccharide PPSV23 (Pneumovax 23) 10/10/2016

## 2022-07-08 ENCOUNTER — Encounter (HOSPITAL_BASED_OUTPATIENT_CLINIC_OR_DEPARTMENT_OTHER): Payer: Self-pay | Admitting: Clinical Social Worker

## 2022-07-09 NOTE — Progress Notes (Signed)
DOCUMENT TYPE:    Social Work - Follow-up Note    PATIENT:    Chancelor, Hardrick  MRN:    [id number]  DOB:    23-Mar-1990    ENCOUNTER DATE:  07/08/2022    ASSOCIATED PROGRAM-CLINIC:  [facility]    CONTACT TYPE:  Telephone    BRIEF DESCRIPTION:  Coverage: Scheduled dentist appointment    PATIENT DESCRIPTION, PRESENTING PROBLEM, PATIENT / FAMILY GOALS:  Name of use is "[name]." Pt is a 32 year old, African American, English-speaking, gay, male with HIV. Pt receives medical care and case management services at [facility] Primary [facility] SW is [name] who is out of the office today. This SW covering.    Pt left VM on primary SW phone requesting help scheduling teeth cleaning at [facility].    INTERVENTION / OUTCOME / PLAN:  This SW reviewed the hard chart and confirmed pt has ROI however [facility] was not on ROI. This SW called pt to update ROI.    This SW called [facility] and scheduled a dental appointment.    Appointment details:  Wednesday, Sept 11 at 9AM (arrive at 8:30am).  [facility]  [address] [place], [place] [address]  [phone]    This appointment is a new pt appointment (last time pt was seen at this dental clinic was for emergency appointment, not new pt appointment). Pt will need to attend new pt appointment before he can schedule a teeth cleaning. Pt needs to bring all of his medications to new pt appointment. This SW called pt back and updated pt on the above. Pt vocalized understanding.    Plan: This SW to copy primary SW [name] on epic note.    DOCUMENT COMPOSED BY:   Colon Branch, MSW [phone] on  07/09/2022

## 2022-07-29 ENCOUNTER — Other Ambulatory Visit (HOSPITAL_BASED_OUTPATIENT_CLINIC_OR_DEPARTMENT_OTHER): Payer: Self-pay | Admitting: Infectious Disease

## 2022-07-30 ENCOUNTER — Other Ambulatory Visit (HOSPITAL_BASED_OUTPATIENT_CLINIC_OR_DEPARTMENT_OTHER): Payer: Self-pay | Admitting: Infectious Disease

## 2022-07-31 ENCOUNTER — Other Ambulatory Visit (HOSPITAL_BASED_OUTPATIENT_CLINIC_OR_DEPARTMENT_OTHER): Payer: Self-pay

## 2022-07-31 ENCOUNTER — Ambulatory Visit: Payer: No Typology Code available for payment source | Attending: Infectious Disease | Admitting: Registered Nurse

## 2022-07-31 DIAGNOSIS — B20 Human immunodeficiency virus [HIV] disease: Secondary | ICD-10-CM | POA: Insufficient documentation

## 2022-07-31 DIAGNOSIS — R300 Dysuria: Secondary | ICD-10-CM | POA: Insufficient documentation

## 2022-07-31 LAB — GC&CHLAM NUCLEIC ACID DETECTN

## 2022-07-31 MED ORDER — DOXYCYCLINE MONOHYDRATE 100 MG OR CAPS
100.0000 mg | ORAL_CAPSULE | Freq: Two times a day (BID) | ORAL | 0 refills | Status: AC
Start: 2022-07-31 — End: 2022-08-07
  Filled 2022-07-31: qty 14, 7d supply, fill #0

## 2022-07-31 NOTE — Progress Notes (Signed)
[facility] 07/31/2022   STI TESTING    Dennis [name] presents to nursing for STI testing. Reviewed name, DOB, and allergies.   Patient confirmed name and date of birth.    CHART REVIEW  Patient is HIV positive: YES  Patient has history of syphilis: YES  Diagnosis code "STD Exposure" entered (if HIV negative): NO  Diagnosis code "HIV Disease" entered (if HIV positive): YES    SUBJECTIVE  Patient is here for routine testing: YES  Patient reports a known exposure: no  Patient reports the following symptoms of STI: dysuria or high urinary frequency, "itching feeling in urethra"    ASSESSMENT  Patient is appropriate for: self testing, treatment of gonorrhea, and treatment of chlamydia   Patient is appropriate for SURRG testing: NO  STI testing orders placed under Dr. Park Breed with results going to [facility] Clinical Support Pool: YES    PLAN/TREATMENT  Patient provided urine specimen, and self-collected rectal and pharyngeal swabs.  Orders submitted this visit:  Orders Placed This Encounter    GC & CT Nucleic Acid Detection - Urine    GC & CT Nucleic Acid Detection - Rectal    GC & CT Nucleic Acid Detection - Throat    RPR Quantitative (Sendout)    doxycycline monohydrate 100 MG capsule      Patient declines injection and blood draw today.  Has provider visit tomorrow and will do then as needed.   Neurosyphilis screen: NO       Patient was encouraged to call clinic or check MyChart for results.    RECOMMENDATIONS  Patient was encouraged to return in 3 months or sooner if needed for follow-up testing: YES  Patient advised to notify any sexual partners of exposure, or contact [facility] at [phone] for anonymous notification: yes if positive  Pt educated not to have sex for the next 7 days: yes if positive    Jarome Matin, RN 07/31/2022

## 2022-08-01 ENCOUNTER — Ambulatory Visit
Payer: No Typology Code available for payment source | Attending: Infectious Disease | Admitting: Unknown Physician Specialty

## 2022-08-01 ENCOUNTER — Ambulatory Visit (HOSPITAL_BASED_OUTPATIENT_CLINIC_OR_DEPARTMENT_OTHER): Payer: Self-pay | Admitting: Unknown Physician Specialty

## 2022-08-01 ENCOUNTER — Other Ambulatory Visit (HOSPITAL_BASED_OUTPATIENT_CLINIC_OR_DEPARTMENT_OTHER): Payer: No Typology Code available for payment source | Admitting: Internal Medicine

## 2022-08-01 ENCOUNTER — Other Ambulatory Visit (HOSPITAL_BASED_OUTPATIENT_CLINIC_OR_DEPARTMENT_OTHER): Payer: Self-pay | Admitting: Infectious Disease

## 2022-08-01 DIAGNOSIS — B20 Human immunodeficiency virus [HIV] disease: Secondary | ICD-10-CM | POA: Insufficient documentation

## 2022-08-01 DIAGNOSIS — M25512 Pain in left shoulder: Secondary | ICD-10-CM | POA: Insufficient documentation

## 2022-08-01 DIAGNOSIS — D709 Neutropenia, unspecified: Secondary | ICD-10-CM | POA: Insufficient documentation

## 2022-08-01 LAB — LIPID PANEL
Cholesterol/HDL Ratio: 2.3
HDL Cholesterol: 71 mg/dL (ref >39)
LDL Cholesterol, NIH Equation: 79 mg/dL (ref <130)
Non-HDL Cholesterol: 89 mg/dL (ref 0–159)
Total Cholesterol: 160 mg/dL (ref <200)
Triglyceride: 44 mg/dL (ref <150)

## 2022-08-01 LAB — GC&CHLAM NUCLEIC ACID DETECTN: N.Gonorrhoeae(GC) Nucleic Acid: NEGATIVE

## 2022-08-01 LAB — 1ST EXTRA LAVENDER TOP

## 2022-08-01 LAB — HEMOGLOBIN A1C, HPLC: Hemoglobin A1C: 5.4 % (ref 4.0–6.0)

## 2022-08-01 NOTE — Progress Notes (Signed)
[facility]  FOLLOW-UP VISIT  08/01/22     ID/CC: Dennis [name] is a 32 year old man with well-controlled HIV here for complaint of left shoulder pain.    ASSESSMENT/PLAN:     #. Left shoulder pain, concern for rotator cuff tendonitis   Given the history of pain particularly with overhead motions and worsened with exercise, suspect rotator cuff tenontitis perhaps. Exam not consistent with a full rotator cuff tear. Doubt infection given not signs of inflammation or fevers. No trauma, but seems patient does exercise a lot, so perhaps an injury from the later. He is open to a physical therapy consult for more intensive treatment. I would recommend using an antiinflammatory more regularly and ice to decrease the inflammation. If these measures do not improve the pain can consider shoulder Korea or MRI to look at soft tissue injury. Doubt that there is any bone pathology to warrant XR. Advised patients that sometime these types of injuries can take significant time to resolve, if pain does not resolve with these measures or if they get worse, he is advised to contact us for consideration of imaging of shoulder. Otherwise, he will be following up with Dr. Lady Gary in 1 month and can follow up on progress at that time.   - Physical therapy referral  - Scheduled ibuprofen 800mg  TID + 1000mg  acetaminophen TID for 3-4 days, PRN after that  - TID icing of shoulder  - patient advised to contact office if pain not improving or is getting worse for possible shoulder Korea or MRI    #. HIV disease (HCC)  Longstanding excellent adherence to DTG + F/TAF, last CD4 >500 in 10/2020, VL UD in 02/2022. Labs next due later in the year. Has standing orders for q48mo monitoring per his request.  Continues on Descovy and Tivicay, no side effects, has not missed any doses.   - dolutegravir (Tivicay) 50 MG tablet; Take 1 tablet  by mouth daily.  - emtricitabine-tenofovir alafenamide (Descovy) 200-25 MG tablet; Take 1 tablet by mouth daily.    #.  Recent STI screening  Patient seen for self testing on 7/17, 3 site GC/Chlamydia all negative. Was given doxy PEP yesterday.    OLD PROBLEMS NOT DISCUSSED    #. Adjustment disorder with anxiety   - DULoxetine 60 MG DR capsule; Take 1 capsule (60 mg) by mouth daily.  Dispense: 30 capsule; Refill: 5    #. Other neutropenia (HCC)  Confirmed Duffy null phenotype; clinically insignificant. Last ANC 880 in 02/2022 -- CTM every year or so to ensure >500.    Return to clinic/follow-up: 1 months with Dr. Lady Gary as previously planned    Sula Soda, DO  Infectious Diseases Fellow     _____________________________  HISTORY OF PRESENT ILLNESS:     Patient seen yesterday for self STI testing and was given an order for Doxy PEP by Dr. Park Breed due to recent sexual activity.    He presents today for complaint of L shoulder pain. It has been going on for 3 weeks now. He has pain with movement in all directions, particularly worse with overhead movements. He typically dances and exercises, he has not been able to exercise at all with the pain he is having which is provoked by any lifting. Not getting better or worse. No hx of surgeries or hardware of shoulder. No trauma, has been keeping the shoulder mobile. He has a friend who is a PT and she gave him some exercises to try, they  have not helped much. He has taken some ibuprofen intermittently, hasn't help too much either. No fevers, swelling, redness, pain at rest. Denies pain in other joints, has never had pain like this in his shoulder.     He continues to take his ART and he reports no side effects or missed doses.    HIV HISTORY:  HIV PCP: [name] --> [facility]  Case Manager: [name]  Diagnosis Date: 03/2014 in [place]  Risk Factors for HIV: MSM  CD4 Nadir: Unk, CD4 404 in 05/2016  ARV/Resistance History: Unk resistance. Started Genvoya at dx --> DTG + FTC/TAC in 06/2016  HIV-Related Illnesses: None known  Viral Hepatitis: HAV immune 01/2016; HBsAb pos, sAg neg, cAb neg  01/2016; HCV NR 02/2017    PHYSICAL EXAMINATION:   Vital Signs: There were no vitals taken for this visit.  Constitutional: Well-appearing, well-groomed, in no distress  MSK: left shoulder without deformity, no erythema or swelling, pain elicited with movement in all directions, but more so with overhead reach, external rotation, and abduction; drop arm test negative.   Skin: no rashes  Neuro: A&Ox3, normal gait  Psych: Calm, but reserved at times, congruent affect, good insight    HIV General Healthcare Maintenance  Cardiovascular/metabolic   Lipids: nl in 10/2017  BP: nl screens at last 4 visits  Glucose control: A1c nl 07/2017  UA: microhematuria in 07/2017 --> resolved on repeat UA 10/2017. Trace proteinuria 01/2019. Normal exam 08/2019     Sexual health and STI screening  Syphilis: serofast RPR 1:4 in 02/2017 after tx for early syphilis in 01/2016 (initial RPR 1:32) --> 1:1 in 10/2017 (likely new serofast titer) --> 1:1 in 03/2018, 08/2018, 01/2019, 08/2019 --> 1:4 in 01/2020 (treated for EL syphilis with BIC x1) --> NR in 02/2022  GC/CT: neg screen x3 in 10/2017, 08/2018, 01/2020, 05/2020 (then throat neg 06/2020 - tested at time of sore throat), 02/2022. Last screen neg x3 sites 07/31/22 (reviewed results with him)  Sexual pleasure: MSM, vers. Able to derive pleasure from sex, although sex has never been the focus of his relationships. No ED or libido changes associated with duloxetine use.   HPV status: declined series  HSV status: no known outbreaks     Cancer screening  Lung: N/A (never tobacco user)  Anal: address as needed at age 35     Immunizations/Serologies and misc testing  Declines flu, COVID bivalent booster, Menveo, TDaP and HPV vaccines   Quantiferon neg 01/2016  Toxo neg 01/2016  HAV immune 01/2016  HBsAb pos (titer 82), sAg neg, cAb neg 01/2016  HCV NR 02/2022    Immunization History   Administered Date(s) Administered    COVID-19 Pfizer mRNA monovalent 12 yrs and older 01/28/2019, 02/17/2019    COVID-19 Pfizer mRNA  monovalent tris-sucrose 12 yrs and older 06/21/2020    Influenza quadrivalent PF 02/08/2016, 10/10/2016    Mpox/smallpox (vaccinia) live 09/03/2020, 10/07/2020    Pneumococcal conjugate PCV13 (Prevnar 13) 05/02/2016    Pneumococcal polysaccharide PPSV23 (Pneumovax 23) 10/10/2016

## 2022-08-01 NOTE — Patient Instructions (Addendum)
Ibuprofen 800mg  every 8 hours  Tylenol 1000mg  every 8 hours  Do this for at least 3 days before giving up on the medications  Physical therapy

## 2022-08-01 NOTE — Result Encounter Note (Signed)
Self-collected STI testing negative for gonorrhea and chlamydia.

## 2022-08-02 LAB — HIV1 RNA QUANTITATION: HIV RNA Result: NOT DETECTED {copies}/mL

## 2022-08-02 LAB — FLOW CYTOMETRY PANEL: Flow Cytometry Number of Reviewed Antibodies: 27

## 2022-08-02 NOTE — Addendum Note (Signed)
Addended by: Lynder Parents on: 08/02/2022 10:25 AM     Modules accepted: Level of Service

## 2022-08-08 ENCOUNTER — Telehealth (HOSPITAL_BASED_OUTPATIENT_CLINIC_OR_DEPARTMENT_OTHER): Payer: Self-pay

## 2022-08-08 NOTE — Telephone Encounter (Addendum)
RETURN CALL: Voicemail - Detailed Message      SUBJECT:  Appointment Request     REASON FOR VISIT: Acute pain of left shoulder     PREFERRED DATE/TIME: 10/24/22    REASON UNABLE TO APPOINT: [facility] unable to schedule in Epic due to error messages.

## 2022-08-13 NOTE — Telephone Encounter (Signed)
Patient is returning text message about his referral that is ready for scheduling.  [facility] is not able to find any available appointments with therapists or teaching per referral instructions.  Please call patient to discuss.

## 2022-09-19 ENCOUNTER — Ambulatory Visit: Payer: 59 | Admitting: Unknown Physician Specialty

## 2022-10-01 ENCOUNTER — Encounter (HOSPITAL_BASED_OUTPATIENT_CLINIC_OR_DEPARTMENT_OTHER): Payer: Self-pay | Admitting: Counselor

## 2022-10-01 NOTE — Progress Notes (Signed)
DOCUMENT TYPE:    Social Work - Follow-up Note    PATIENT:    Dennis [name], Dennis [name]  MRN:    [id number]  DOB:    July 21, 1990    ENCOUNTER DATE:  10/01/2022    ASSOCIATED PROGRAM-CLINIC:  [facility]    CONTACT TYPE:  Telephone    BRIEF DESCRIPTION:  EIP/ Paystubs    PATIENT DESCRIPTION, PRESENTING PROBLEM, PATIENT / FAMILY GOALS:  32 year old African American gay male with HIV, CD4 count 412, VL undetectable, on ART meds established HIV care and case management at [facility]. Pt diagnosed with HIV on 03/21/2014 previous provider Dr. Staci Righter at [place], [place]. His family does not know about his diagnosis, but some his friends know. [name] lost job, on unemployment benefit, on [place] and EIP coverage.    EIP expired the end of this month. Pt. e-mailed paystubs.    INTERVENTION / OUTCOME / PLAN:  SWR printed paystubs and gave to [facility] for [employer]. SWR called pt. re: service plan and charity coverage. Writer left message for pt.    Plan:  1. Assist pt. with medical coverage.    DOCUMENT COMPOSED BY:   [name], MSW [phone] on  10/01/2022

## 2022-10-02 ENCOUNTER — Encounter (HOSPITAL_BASED_OUTPATIENT_CLINIC_OR_DEPARTMENT_OTHER): Payer: Self-pay | Admitting: Counselor

## 2022-10-03 NOTE — Progress Notes (Signed)
DOCUMENT TYPE:    Social Work - Follow-up Note    PATIENT:    Dennis [name], Dennis [name]  MRN:    [id number]  DOB:    1990-03-13    ENCOUNTER DATE:  10/02/2022    ASSOCIATED PROGRAM-CLINIC:  [facility]    CONTACT TYPE:  Telephone    BRIEF DESCRIPTION:  RE: Charity expired/ Service plan completed in Provide/ Adherence    PATIENT DESCRIPTION, PRESENTING PROBLEM, PATIENT / FAMILY GOALS:  32 year old African American gay male with HIV, CD4 count 551, VL undetectable and on ART meds. Dennis [name] established HIV care and case management at [facility]. Pt diagnosed with HIV on 03/21/2014 previous provider Dr. Staci Righter at [place], [place]. His family does not know about his diagnosis, but some his friends know. Pt. is on [employer], EIP and charity coverage, [employer] for [employer]. PCP Jerlyn Ly MD    [facility] care expired on 09/14/2022, Service plan expired.    INTERVENTION / OUTCOME / PLAN:  SWR communicated with Dennis [name] about charity care, adherence and update service plan. Went over [employer] with pt. and completed in Provide. Pt. lives in stable housing. Dennis [name] started a new job in March at [employer] full time $20.50/hr. Pt. is taking HAART meds regularly. Writer to assist pt. with medical coverage renewal and request require documents to complete coverage. SWR also to assist pt. with dental care services through LDP and continue to complete re-assessment for dental eligibility. Writer printed to months paystubs, will complete charity care application and submit for renewal. Support pt. to continue engage in medical care.    Plan:  1. Assist pt. to engage in medical care.  2. Assist pt. with medical coverage.  3. Support with adherence to meds.    DOCUMENT COMPOSED BY:   [name], MSW [phone] on  10/03/2022

## 2022-10-04 ENCOUNTER — Encounter (HOSPITAL_BASED_OUTPATIENT_CLINIC_OR_DEPARTMENT_OTHER): Payer: Self-pay | Admitting: Counselor

## 2022-10-04 NOTE — Progress Notes (Signed)
DOCUMENT TYPE:    Social Work - Follow-up Note    PATIENT:    Dennis [name], Dennis [name]  MRN:    [id number]  DOB:    [birth year]    ENCOUNTER DATE:  10/04/2022    ASSOCIATED PROGRAM-CLINIC:  [facility]    CONTACT TYPE:  Collateral    BRIEF DESCRIPTION:  Charity care completed    PATIENT DESCRIPTION, PRESENTING PROBLEM, PATIENT / FAMILY GOALS:  32 year old African American gay male with HIV, CD4 count 551, VL undetectable and on ART meds. [name] established HIV care and case management at [facility] [place]. Pt diagnosed with HIV on 03/21/2014 previous provider Dr. Staci Righter at [place], [place]. His family does not know about his diagnosis, but some his friends know. Pt. is on [employer], EIP and charity coverage, [employer] for [employer]. PCP Jerlyn Ly MD    [facility] care expired on 09/14/2022    INTERVENTION / OUTCOME / PLAN:  SWR completed charity care application, attached paystubs and submitted.    Plan:  1. Assist pt. with charity care renewal.    DOCUMENT COMPOSED BY:   [name], MSW [phone] on  10/04/2022

## 2022-10-23 ENCOUNTER — Encounter (HOSPITAL_BASED_OUTPATIENT_CLINIC_OR_DEPARTMENT_OTHER): Payer: Self-pay | Admitting: Counselor

## 2022-10-24 NOTE — Progress Notes (Signed)
DOCUMENT TYPE:    Social Work - Follow-up Note    PATIENT:    Dennis [name], Dennis [name]  MRN:    [id number]  DOB:    1990/09/15    ENCOUNTER DATE:  10/23/2022    ASSOCIATED PROGRAM-CLINIC:  [facility]    CONTACT TYPE:  Telephone    BRIEF DESCRIPTION:  RE: Dental care/ LDP coverage    PATIENT DESCRIPTION, PRESENTING PROBLEM, PATIENT / FAMILY GOALS:  32 year old African American gay male with HIV, CD4 count 551, VL undetectable and on ART meds. [name] established HIV care and case management at [facility]. Pt diagnosed with HIV on 03/21/2014 previous provider Dr. Staci Righter at [place], [place]. His family does not know about his diagnosis, but some his friends know. Pt. is on [employer], EIP and charity coverage, [employer] for [employer]. PCP Jerlyn Ly MD.    Pt. called to request assistance with dental coverage. Pt. c/o [facility] canceled dental appt he scheduled for today.    INTERVENTION / OUTCOME / PLAN:  SWR communicated with pt. re: dental coverage through LDP. Pt. said he went for tooth fillings but told his appt was cancelled because his dental coverage through LDP expired. This appt. was made through [facility] coordinator, and it must be a mistake. SWR called LDP coordinator and left message. Writer also [employer] about the above.    Lourdes B called back Clinical research associate about dental coverage. Writer checked with Clarita Crane about [place] dental coverage and [facility] canceled pt's dental fill appt today. Lourdes said pt's dental coverage extended thru 09/14/2023. She is going to contact [facility] about renewal and pt. to keep his future appt.    SWR called [name] and explained what LDP coordinator said and pt. to keep his future dental appt. His next dental appt. is on 10/15 and 10/22. Pt. appreciated with this assistance.    Plan:  1. Assist pt. with dental coverage.    DOCUMENT COMPOSED BY:   [name], MSW [phone] on  10/24/2022

## 2022-10-29 ENCOUNTER — Other Ambulatory Visit: Payer: Self-pay

## 2022-12-24 ENCOUNTER — Encounter (HOSPITAL_BASED_OUTPATIENT_CLINIC_OR_DEPARTMENT_OTHER): Payer: Self-pay | Admitting: Counselor

## 2022-12-26 NOTE — Progress Notes (Signed)
 DOCUMENT TYPE:    Social Work - Follow-up Note    PATIENT:    Dennis [name], Dennis [name]  MRN:    [id number]  DOB:    [birth year]    ENCOUNTER DATE:  12/24/2022    ASSOCIATED PROGRAM-CLINIC:  [facility]    CONTACT TYPE:  Telephone    BRIEF DESCRIPTION:  WHPF/ QHP enrolled/ New job/unemployment benefit ended/ Plan confirmation/ Data summited/ Adhernce    PATIENT DESCRIPTION, PRESENTING PROBLEM, PATIENT / FAMILY GOALS:  32 year old African American gay male with HIV, CD4 count 551, VL undetectable and on ART meds. [name] established HIV care and case management at [facility]. Pt diagnosed with HIV on 03/21/2014 previous provider Dr. Staci Righter at [place], [place]. His family does not know about his diagnosis, but some his friends know. Pt. is on [employer], EIP and charity coverage, [employer] for [employer]. PCP Jerlyn Ly MD.    SWR called [name] re: 2025 QHP and report changes on WHPF acct.    INTERVENTION / OUTCOME / PLAN:  SWR communicated with [name] about update online WHPF acct for next year QHP coverage. Went over [facility] application with pt. [name] is working at [employer] full time since April 2024, monthly income [phone]. Writer updated new job in the system, ESI declined due to poor coverage. Pt's unemployment benefit ended on 04/13/2022, this income removed from [facility] acct. Pt. approved with QHP, no tax credit. Pt. lives at the same address, wants to keep the same coverage as 2024, [employer]. Premium without tax credit $742.99/mo. Completed Data form, attached plan confirmation and submitted.    Pt. is taking ART meds regularly. Discussed about dental and vision coverage through employer. No other social work assistance needed.    Plan:  1. Assist pt. to engage in medical care.  2. Assist pt. with medical coverage.  3. Support pt. with adherence to ART meds.  4. Assist with dental care services.    DOCUMENT COMPOSED BY:   [name], MSW [phone] on  12/26/2022

## 2023-01-01 ENCOUNTER — Other Ambulatory Visit (HOSPITAL_BASED_OUTPATIENT_CLINIC_OR_DEPARTMENT_OTHER): Payer: Self-pay | Admitting: Infectious Disease

## 2023-01-02 ENCOUNTER — Other Ambulatory Visit (HOSPITAL_BASED_OUTPATIENT_CLINIC_OR_DEPARTMENT_OTHER): Payer: Self-pay

## 2023-01-02 ENCOUNTER — Ambulatory Visit: Payer: No Typology Code available for payment source | Attending: Infectious Disease | Admitting: Nursing

## 2023-01-02 ENCOUNTER — Other Ambulatory Visit (HOSPITAL_BASED_OUTPATIENT_CLINIC_OR_DEPARTMENT_OTHER): Payer: Self-pay | Admitting: Infectious Disease

## 2023-01-02 DIAGNOSIS — R300 Dysuria: Secondary | ICD-10-CM

## 2023-01-02 DIAGNOSIS — N342 Other urethritis: Secondary | ICD-10-CM

## 2023-01-02 DIAGNOSIS — Z113 Encounter for screening for infections with a predominantly sexual mode of transmission: Secondary | ICD-10-CM

## 2023-01-02 DIAGNOSIS — B20 Human immunodeficiency virus [HIV] disease: Secondary | ICD-10-CM | POA: Insufficient documentation

## 2023-01-02 LAB — URINALYSIS WITH REFLEX CULTURE
Bacteria, URN: NONE SEEN
Bilirubin (Qual), URN: NEGATIVE
Epith Cells_Renal/Trans,URN: NEGATIVE /HPF
Epith Cells_Squamous, URN: NEGATIVE /LPF
Glucose Qual, URN: NEGATIVE
Ketones, URN: NEGATIVE
Leukocyte Esterase, URN: NEGATIVE
Nitrite, URN: NEGATIVE
Occult Blood, URN: NEGATIVE
RBC, URN: NEGATIVE /HPF
Specific Gravity, URN: 1.028 g/mL — ABNORMAL HIGH (ref 1.006–1.027)
WBC, URN: NEGATIVE /HPF
pH, URN: 7.5 (ref 5.0–8.0)

## 2023-01-02 LAB — COMPREHENSIVE METABOLIC PANEL
ALT (GPT): 12 U/L (ref 10–64)
AST (GOT): 15 U/L (ref 9–38)
Albumin: 4.3 g/dL (ref 3.5–5.2)
Alkaline Phosphatase (Total): 80 U/L (ref 35–109)
Anion Gap: 7 (ref 4–12)
Bilirubin (Total): 0.5 mg/dL (ref 0.2–1.3)
Calcium: 9.2 mg/dL (ref 8.9–10.2)
Carbon Dioxide, Total: 29 meq/L (ref 22–32)
Chloride: 104 meq/L (ref 98–108)
Creatinine: 1.07 mg/dL (ref 0.51–1.18)
Glucose: 84 mg/dL (ref 62–125)
Potassium: 3.6 meq/L (ref 3.6–5.2)
Protein (Total): 7.3 g/dL (ref 6.0–8.2)
Sodium: 140 meq/L (ref 135–145)
Urea Nitrogen: 6 mg/dL — ABNORMAL LOW (ref 8–21)
eGFR by CKD-EPI 2021: >60 mL/min/{1.73_m2} (ref >59)

## 2023-01-02 LAB — 1ST EXTRA GOLD TOP

## 2023-01-02 MED ORDER — CEFTRIAXONE SODIUM 500 MG IJ SOLR
500.0000 mg | Freq: Once | INTRAMUSCULAR | Status: AC
Start: 2023-01-02 — End: 2023-01-06
  Administered 2023-01-06: 500 mg via INTRAMUSCULAR

## 2023-01-02 MED ORDER — DOXYCYCLINE MONOHYDRATE 100 MG OR CAPS
100.0000 mg | ORAL_CAPSULE | Freq: Two times a day (BID) | ORAL | 0 refills | Status: AC
Start: 2023-01-02 — End: 2023-01-09
  Filled 2023-01-02: qty 14, 7d supply, fill #0

## 2023-01-02 NOTE — Progress Notes (Signed)
 [facility] 01/02/2023   STI TESTING    Dennis [name] presents to nursing for STI testing. Reviewed name, DOB, and allergies.   Patient confirmed name and date of birth.    CHART REVIEW  Patient is HIV positive: YES  Patient has history of syphilis: YES  Diagnosis code "STD Exposure" entered (if HIV negative): NO  Diagnosis code "HIV Disease" entered (if HIV positive): YES    SUBJECTIVE  Patient is here for routine testing: YES  Patient reports a known exposure: no  Patient reports the following symptoms of STI: white, yellow, or green discharge-  yellow urethral discharge x 1 week, dysuria, and lower back soreness x 1 week, sx similar to what he experienced in July 2024- concerned he has UTI.     Consult with Attending- Dr Valrie Hart, ordered UA and presumptive GC/Chlamydia tx.  Patient wants to wait for test results prior to treating. Patient picked up doxycyline from [facility] and will take medication if Chlamydia test result is positive. Pt will return to clinic on 12/20 for ceftriaxone injection if GC test is positive.     ASSESSMENT  Patient is appropriate for: self testing  STI testing orders placed under Dr. Park Breed with results going to [facility] Clinical Support Pool: YES    PLAN/TREATMENT  Patient provided urine specimen, and self-collected rectal and pharyngeal swabs.  Orders submitted this visit:  Orders Placed This Encounter    Comprehensive Metabolic Panel    HIV1 Rna Quantitation    RPR Quantitative (Sendout)    Urinalysis Culture with Reflex     Blood draw obtained by this RN without complication BD from left AC space for RPR, CMP and HIV VL.     Patient was encouraged to call clinic or check MyChart for results.    RECOMMENDATIONS  Patient was encouraged to return in 3 months or sooner if needed for follow-up testing: YES  Patient advised to notify any sexual partners of exposure, or contact [facility] at [phone] for anonymous notification: yes  Pt educated not to have sex for  the next 7 days: yes    Faith Rogue, RN 01/02/2023

## 2023-01-03 LAB — HIV1 RNA QUANTITATION: HIV RNA Result: NOT DETECTED {copies}/mL

## 2023-01-04 LAB — GC&CHLAM NUCLEIC ACID DETECTN
Chlam Trachomatis Nucleic Acid: NEGATIVE
N.Gonorrhoeae(GC) Nucleic Acid: NEGATIVE

## 2023-01-05 LAB — GC&CHLAM NUCLEIC ACID DETECTN
Chlam Trachomatis Nucleic Acid: NEGATIVE
Chlam Trachomatis Nucleic Acid: NEGATIVE
N.Gonorrhoeae(GC) Nucleic Acid: POSITIVE — AB
N.Gonorrhoeae(GC) Nucleic Acid: POSITIVE — AB

## 2023-01-06 ENCOUNTER — Ambulatory Visit: Payer: No Typology Code available for payment source | Attending: Infectious Disease | Admitting: Registered Nurse

## 2023-01-06 DIAGNOSIS — N342 Other urethritis: Secondary | ICD-10-CM | POA: Insufficient documentation

## 2023-01-06 LAB — RPR QUANTITATIVE (SENDOUT): RPR Quantitative (Sendout): NONREACTIVE

## 2023-01-06 MED ORDER — CEFTRIAXONE SODIUM 500 MG IJ SOLR
INTRAMUSCULAR | Status: AC
Start: 2023-01-06 — End: 2023-01-06
  Filled 2023-01-06: qty 500

## 2023-01-06 NOTE — Progress Notes (Signed)
 [facility] 01/06/2023   STI TREATMENT    Kahekili Gorbett presents to clinic for treatment of gonorrhea .  Patient confirmed name and date of birth.    CHART REVIEW  Lab results confirmed in chart review: YES  Confirmed orders were placed for treatment: YES  Informed attending of lab results and orders placed for treatment: NO      TREATMENT  No orders of the defined types were placed in this encounter.     Medication Administrations This Visit         cefTRIAXone (Rocephin) injection 500 mg Admin Date  01/06/2023  08:26 Action  Given Dose  500 mg Route  Intramuscular Site  Left Dorsogluteal Documented By  Jarome Matin, RN    Ordering Provider: Howell Pringle, MD    NDC: [phone]    Lot#: [id number]               RECOMMENDATIONS  Patient was encouraged to return in 3 months or sooner if needed for follow-up testing: YES  Patient advised to notify any sexual partners of exposure, or contact [facility] at [phone] for anonymous notification: yes  Pt educated not to have sex for the next 7 days: yes    Jarome Matin, RN 01/06/2023

## 2023-01-21 ENCOUNTER — Other Ambulatory Visit (HOSPITAL_BASED_OUTPATIENT_CLINIC_OR_DEPARTMENT_OTHER): Payer: Self-pay | Admitting: Infectious Disease

## 2023-01-21 DIAGNOSIS — B20 Human immunodeficiency virus [HIV] disease: Secondary | ICD-10-CM

## 2023-01-21 DIAGNOSIS — F4322 Adjustment disorder with anxiety: Secondary | ICD-10-CM

## 2023-01-23 MED ORDER — DOLUTEGRAVIR SODIUM 50 MG OR TABS
ORAL_TABLET | ORAL | 0 refills | Status: DC
Start: 2023-01-23 — End: 2023-03-14

## 2023-01-23 MED ORDER — DULOXETINE HCL 60 MG OR CPEP
60.0000 mg | DELAYED_RELEASE_CAPSULE | Freq: Every day | ORAL | 0 refills | Status: DC
Start: 2023-01-23 — End: 2023-03-14

## 2023-01-23 MED ORDER — DESCOVY 200-25 MG OR TABS
1.0000 | ORAL_TABLET | Freq: Every day | ORAL | 0 refills | Status: DC
Start: 2023-01-23 — End: 2023-03-14

## 2023-03-13 ENCOUNTER — Other Ambulatory Visit (HOSPITAL_BASED_OUTPATIENT_CLINIC_OR_DEPARTMENT_OTHER): Payer: Self-pay | Admitting: Infectious Disease

## 2023-03-13 DIAGNOSIS — F4322 Adjustment disorder with anxiety: Secondary | ICD-10-CM

## 2023-03-13 DIAGNOSIS — B20 Human immunodeficiency virus [HIV] disease: Secondary | ICD-10-CM

## 2023-03-14 MED ORDER — DOLUTEGRAVIR SODIUM 50 MG OR TABS
ORAL_TABLET | ORAL | 0 refills | Status: DC
Start: 2023-03-14 — End: 2023-04-07

## 2023-03-14 MED ORDER — DESCOVY 200-25 MG OR TABS
ORAL_TABLET | ORAL | 0 refills | Status: DC
Start: 2023-03-14 — End: 2023-04-07

## 2023-03-14 MED ORDER — DULOXETINE HCL 60 MG OR CPEP
60.0000 mg | DELAYED_RELEASE_CAPSULE | Freq: Every day | ORAL | 0 refills | Status: DC
Start: 2023-03-14 — End: 2023-04-28

## 2023-03-14 NOTE — Telephone Encounter (Signed)
 Patient last seen on 08/01/22 and was to return in 1 month with PCP.  One refill (30 days) authorized.  Please schedule follow up visit.

## 2023-03-19 ENCOUNTER — Telehealth (HOSPITAL_BASED_OUTPATIENT_CLINIC_OR_DEPARTMENT_OTHER): Payer: Self-pay | Admitting: Infectious Disease

## 2023-03-19 NOTE — Telephone Encounter (Signed)
 Called pt and left vm requesting pt call back to schedule a follow-up    PLEASE SCHEDULE THE FOLLOWING:     Patient:  Dennis [name], [id number]     Department:  [facility] [[id number]]     Provider/Subgroup: Dr. Lady Gary, or any under subgroup "[facility] Providers"     Visit Type:  RETURN [9002] or TELEMEDICINE RETURN [9020]     Block Type:  30 minutes     Urgency:  Routine     Additional Info: Please schedule with any provider if pt would like to be seen before Dr. Lady Gary (PCP) is available. Please follow scheduling instructions. Assign all related appointments to referral.

## 2023-04-07 ENCOUNTER — Ambulatory Visit: Attending: Unknown Physician Specialty | Admitting: Unknown Physician Specialty

## 2023-04-07 ENCOUNTER — Other Ambulatory Visit (HOSPITAL_BASED_OUTPATIENT_CLINIC_OR_DEPARTMENT_OTHER): Payer: Self-pay | Admitting: Unknown Physician Specialty

## 2023-04-07 VITALS — BP 125/83 | HR 64 | Temp 98.6°F | Ht 71.0 in | Wt 184.0 lb

## 2023-04-07 DIAGNOSIS — B20 Human immunodeficiency virus [HIV] disease: Secondary | ICD-10-CM | POA: Insufficient documentation

## 2023-04-07 MED ORDER — DOLUTEGRAVIR SODIUM 50 MG OR TABS
ORAL_TABLET | ORAL | 2 refills | Status: DC
Start: 2023-04-07 — End: 2023-07-21

## 2023-04-07 MED ORDER — INFLUENZA VIRUS VACC SPLIT PF 0.5 ML IM SUSY
0.5000 mL | PREFILLED_SYRINGE | Freq: Once | INTRAMUSCULAR | Status: DC
Start: 2023-04-07 — End: 2023-04-07

## 2023-04-07 MED ORDER — DESCOVY 200-25 MG OR TABS
1.0000 | ORAL_TABLET | Freq: Every day | ORAL | 2 refills | Status: DC
Start: 2023-04-07 — End: 2023-07-21

## 2023-04-07 NOTE — Progress Notes (Signed)
 Dennis [name]  FOLLOW-UP VISIT  04/07/2023    ID/CC: Dennis [name] is a 33 year old male PMH HIV p/f f/u.    HISTORY OF PRESENT ILLNESS:   He had a self-limiting GI illness last week, managed with pepto.    ART: Recent pharmacy fill % was 75% uptime, but he reports he had a slight backlog to cover it and had just waited until he was nearly out.    STI: would like triple site testing    Vax: would prefer not to discuss    HIV History:  HIV PCP: Dennis [name] --> Dennis [name]  Case Manager: Dennis [name]  Diagnosis Date: 03/2014 in [place]  Risk Factors for HIV: MSM  CD4 Nadir: Unk, CD4 404 in 05/2016  ARV/Resistance History: Unk resistance. Started Genvoya at dx --> DTG + FTC/TAC in 06/2016  HIV-Related Illnesses: None known  Viral Hepatitis: HAV immune 01/2016; HBsAb pos, sAg neg, cAb neg 01/2016; HCV NR 02/2017    PHYSICAL EXAMINATION:   Vital Signs: BP 125/83   Pulse 64   Temp 37 C (Temporal)   Ht 5\' 11"  (1.803 m)   Wt 83.5 kg (184 lb)   SpO2 98%   BMI 25.66 kg/m   Gen: Comfortable, in NAD.   Eyes: eyelids normal, conjunctiva clear, pupils equal.  No scleral icterus.  Lungs: nl resp effort.   Heart: CV regular rate  Abd: ND.  Psych: appropriate affect. Intact insight.    Other Labs  Quantiferon neg 01/2016  Toxo neg 01/2016  HAV immune 01/2016  HBsAb pos (titer 82), sAg neg, cAb neg 01/2016  HCV NR 02/2022    ASSESSMENT/PLAN:   33 year old male PMH HIV p/f f/u.    HIV disease (HCC)  CD4 >500 in 10/2020, VLUD in 01/02/2023  ART: DTG + F/TAF; refill sent  Statin: SDM  Labs: VL     Screen STI  History syphilis  Contact w and expected exposure to STI  Triple site  RPR  HCV  Doxy PEP no discussed today    Need for Immunization  Previously declined most vaccines. Due for multiple but prefers to defer discussion.  COVID  Flu  PCV 20  Shingrix  MenACYW  TDAP    Return to [name]/follow-up: 66mo, PCP    Clayburn Pert, MD

## 2023-04-08 LAB — HIV1 RNA QUANTITATION: HIV RNA Result: NOT DETECTED {copies}/mL

## 2023-04-08 LAB — GC&CHLAM NUCLEIC ACID DETECTN
Chlam Trachomatis Nucleic Acid: NEGATIVE
Chlam Trachomatis Nucleic Acid: NEGATIVE
Chlam Trachomatis Nucleic Acid: NEGATIVE
N.Gonorrhoeae(GC) Nucleic Acid: NEGATIVE
N.Gonorrhoeae(GC) Nucleic Acid: NEGATIVE
N.Gonorrhoeae(GC) Nucleic Acid: NEGATIVE

## 2023-04-08 LAB — HEPATITIS C AB WITH REFLEX PCR: Hepatitis C Antibody w/Rflx PCR: NONREACTIVE

## 2023-04-11 LAB — RPR QUANTITATIVE (SENDOUT): RPR Quantitative (Sendout): NONREACTIVE

## 2023-04-27 ENCOUNTER — Other Ambulatory Visit (HOSPITAL_BASED_OUTPATIENT_CLINIC_OR_DEPARTMENT_OTHER): Payer: Self-pay | Admitting: Infectious Disease

## 2023-04-27 DIAGNOSIS — F4322 Adjustment disorder with anxiety: Secondary | ICD-10-CM

## 2023-04-28 MED ORDER — DULOXETINE HCL 60 MG OR CPEP
60.0000 mg | DELAYED_RELEASE_CAPSULE | Freq: Every day | ORAL | 2 refills | Status: DC
Start: 2023-04-28 — End: 2023-07-23

## 2023-06-30 ENCOUNTER — Encounter (HOSPITAL_BASED_OUTPATIENT_CLINIC_OR_DEPARTMENT_OTHER): Payer: Self-pay

## 2023-06-30 ENCOUNTER — Ambulatory Visit

## 2023-06-30 NOTE — Progress Notes (Signed)
 [name] did not cancel and was not present for a scheduled appointment today.  Disposition: Chart reviewed, patient contacted by phone regarding follow-up

## 2023-07-04 ENCOUNTER — Telehealth (HOSPITAL_BASED_OUTPATIENT_CLINIC_OR_DEPARTMENT_OTHER): Payer: Self-pay | Admitting: Infectious Disease

## 2023-07-04 NOTE — Telephone Encounter (Signed)
 PLEASE SCHEDULE THE FOLLOWING:     Patient:  Dennis [name], [id number]     Department:  [facility] [[id number]]     Provider/Subgroup: Dr. Marolyn     Visit Type:  RETURN [9002]      Block Type:  30 minutes     Urgency:  Routine     Additional Info: Please follow scheduling instructions. Assign all related appointments to referral.

## 2023-07-21 ENCOUNTER — Other Ambulatory Visit (HOSPITAL_BASED_OUTPATIENT_CLINIC_OR_DEPARTMENT_OTHER): Payer: Self-pay | Admitting: Infectious Disease

## 2023-07-21 ENCOUNTER — Other Ambulatory Visit (HOSPITAL_BASED_OUTPATIENT_CLINIC_OR_DEPARTMENT_OTHER): Payer: Self-pay | Admitting: Unknown Physician Specialty

## 2023-07-21 DIAGNOSIS — B20 Human immunodeficiency virus [HIV] disease: Secondary | ICD-10-CM

## 2023-07-21 DIAGNOSIS — F4322 Adjustment disorder with anxiety: Secondary | ICD-10-CM

## 2023-07-22 ENCOUNTER — Encounter (HOSPITAL_BASED_OUTPATIENT_CLINIC_OR_DEPARTMENT_OTHER): Payer: Self-pay | Admitting: Counselor

## 2023-07-23 ENCOUNTER — Encounter (HOSPITAL_BASED_OUTPATIENT_CLINIC_OR_DEPARTMENT_OTHER): Payer: Self-pay | Admitting: Counselor

## 2023-07-23 MED ORDER — DESCOVY 200-25 MG OR TABS
1.0000 | ORAL_TABLET | Freq: Every day | ORAL | 0 refills | Status: DC
Start: 2023-07-23 — End: 2023-08-26

## 2023-07-23 MED ORDER — DULOXETINE HCL 60 MG OR CPEP
60.0000 mg | DELAYED_RELEASE_CAPSULE | Freq: Every day | ORAL | 0 refills | Status: DC
Start: 2023-07-23 — End: 2023-08-28

## 2023-07-23 MED ORDER — DOLUTEGRAVIR SODIUM 50 MG OR TABS
ORAL_TABLET | ORAL | 0 refills | Status: DC
Start: 2023-07-23 — End: 2023-08-26

## 2023-07-23 NOTE — Progress Notes (Signed)
 DOCUMENT TYPE:    Social Work - Follow-up Note    PATIENT:    Dennis [name], Dennis [name]  MRN:    [id number]  DOB:    09-28-1990    ENCOUNTER DATE:  07/22/2023    ASSOCIATED PROGRAM-CLINIC:  [facility]    CONTACT TYPE:  Email    BRIEF DESCRIPTION:  High Medical bill    PATIENT DESCRIPTION, PRESENTING PROBLEM, PATIENT / FAMILY GOALS:  33 year old African American gay male with HIV, CD4 count 551, VL undetectable and on ART meds. Abby established HIV care and case management at [facility]. Pt diagnosed with HIV on 03/21/2014 previous provider Dr. Lamar Bucks at [place], [place]. His family does not know about his diagnosis, but some his friends know. Pt. is on [employer], EIP and charity coverage, [employer] for [employer]. PCP Marolyn Cyndee Barter MD.    INTERVENTION / OUTCOME / PLAN:  SWR received e-mail from pt. about high medical bills amount of $8,908.11. Writer will check in EPIP to verify if this bill is self-pay or sent to insurance.    Plan:  1. Assist pt. with medical bill.    DOCUMENT COMPOSED BY:   Annye Slay, MSW [phone] on  07/23/2023

## 2023-07-24 NOTE — Progress Notes (Signed)
 DOCUMENT TYPE:    Social Work - Follow-up Note    PATIENT:    Dennis [name], Dennis [name]  MRN:    [id number]  DOB:    [birth year]    ENCOUNTER DATE:  07/23/2023    ASSOCIATED PROGRAM-CLINIC:  [facility]    CONTACT TYPE:  Collateral    BRIEF DESCRIPTION:  Medical bill    PATIENT DESCRIPTION, PRESENTING PROBLEM, PATIENT / FAMILY GOALS:  33 year old African American gay male with HIV, CD4 count 551, VL undetectable and on ART meds. Dennis [name] established HIV care and case management at [facility]. Pt diagnosed with HIV on 03/21/2014 previous provider Dr. Lamar Bucks at [place], [place]. His family does not know about his diagnosis, but some his friends know. Pt. is on [employer], EIP and charity coverage, [employer] for [employer]. PCP Dennis Cyndee Barter MD.    INTERVENTION / OUTCOME / PLAN:  SWR e-mailed to pt. acct representative Dennis [name] medical bill [place] received [employer] [phone] and Guar# [phone]. Pt. is active on [employer] Gold and EIP coverage.    Plan:  1. Assist pt. with medical bill.    DOCUMENT COMPOSED BY:   Annye Slay, MSW [phone] on  07/24/2023

## 2023-07-28 ENCOUNTER — Encounter (HOSPITAL_BASED_OUTPATIENT_CLINIC_OR_DEPARTMENT_OTHER): Payer: Self-pay | Admitting: Counselor

## 2023-07-29 ENCOUNTER — Encounter (HOSPITAL_BASED_OUTPATIENT_CLINIC_OR_DEPARTMENT_OTHER): Payer: Self-pay | Admitting: Counselor

## 2023-07-29 NOTE — Progress Notes (Signed)
 DOCUMENT TYPE:    Social Work - Follow-up Note    PATIENT:    Dennis [name], Dennis [name]  MRN:    [id number]  DOB:    [birth year]    ENCOUNTER DATE:  07/28/2023    ASSOCIATED PROGRAM-CLINIC:  [place]    CONTACT TYPE:  Email    BRIEF DESCRIPTION:  another e-mail re: medical bill    PATIENT DESCRIPTION, PRESENTING PROBLEM, PATIENT / FAMILY GOALS:  33 year old African American gay male with HIV, CD4 count 551, VL undetectable and on ART meds. Abby established HIV care and case management at [facility]. Pt diagnosed with HIV on 03/21/2014 previous provider Dr. Lamar Bucks at [place], [place]. His family does not know about his diagnosis, but some his friends know. Pt. is on [employer] Cascade Gold, EIP and charity coverage, [employer] for [employer]. PCP Marolyn Cyndee Barter MD.    Pt. e-mailed received high medical bill/ the same medical bill    INTERVENTION / OUTCOME / PLAN:  SWR e-mailed pt. that last week the same bill e-mailed to pt's acct representative to assist with correction and waiting on response. Will contact pt. upon receiving response from billing.    Plan:  1. Assist pt. with medical bill    DOCUMENT COMPOSED BY:   Annye Slay, MSW [phone] on  07/29/2023

## 2023-07-30 NOTE — Progress Notes (Signed)
 DOCUMENT TYPE:    Social Work - Follow-up Note    PATIENT:    Dennis [name], Dennis [name]  MRN:    [id number]  DOB:    [birth year]    ENCOUNTER DATE:  07/29/2023    ASSOCIATED PROGRAM-CLINIC:  [facility]    CONTACT TYPE:  Collateral    BRIEF DESCRIPTION:  [place] billing correction    PATIENT DESCRIPTION, PRESENTING PROBLEM, PATIENT / FAMILY GOALS:  33 year old African American gay male with HIV, CD4 count 551, VL undetectable and on ART meds. Abby established HIV care and case management at [facility]. Pt diagnosed with HIV on 03/21/2014 previous provider Dr. Lamar Bucks at [place], [place]. His family does not know about his diagnosis, but some his friends know. Pt. is on [employer] Cascade Gold, EIP and charity coverage, [employer] for [employer]. PCP Marolyn Cyndee Barter MD.    INTERVENTION / OUTCOME / PLAN:  SWR communicated with [facility] medical billing re: Derinda [phone]. After checking in the system [place] billing CS person found out that this bill sent to EIP instead of primary coverage [employer] Gold.  bill CS person will redirect this bill to primary coverage.    Plan:  1. Assist pt. with medical bills    DOCUMENT COMPOSED BY:   Annye Slay, MSW [phone] on  07/30/2023

## 2023-07-31 ENCOUNTER — Encounter (HOSPITAL_BASED_OUTPATIENT_CLINIC_OR_DEPARTMENT_OTHER): Payer: Self-pay | Admitting: Counselor

## 2023-08-04 NOTE — Progress Notes (Signed)
 DOCUMENT TYPE:    Social Work - Follow-up Note    PATIENT:    Dennis [name], Dennis [name]  MRN:    [id number]  DOB:    02/16/1990    ENCOUNTER DATE:  07/31/2023    ASSOCIATED PROGRAM-CLINIC:  [facility]    CONTACT TYPE:  Telephone    BRIEF DESCRIPTION:  Medical care/ Service plan/ ROI/ Eye exam/ Adherence    PATIENT DESCRIPTION, PRESENTING PROBLEM, PATIENT / FAMILY GOALS:  33 year old African American gay male with HIV, CD4 count 551, VL undetectable and on ART meds. Dennis [name] established HIV care and case management at [facility]. Pt diagnosed with HIV on 03/21/2014 previous provider Dr. Lamar Bucks at [place], [place]. His family does not know about his diagnosis, but some his friends know. Pt. is on [employer] Cascade Gold, EIP and charity coverage, [employer] for [employer]. PCP Marolyn Cyndee Barter MD.    Service plan and Release of information expired.    INTERVENTION / OUTCOME / PLAN:  SWR communicated with pt. about medical care, service plan, adherence and ROI. Pt. is working at the same place [employer]. EIP will expire on 09/14/2023. Writer reminded pt. to e-mail 2 mos. paystubs for EIP renewal. Went over service plan with pt. and completed. Pt. is taking ART meds regularly. [name] lives in stable house/ apartment. No MH/CD issue. Pt. requested resources for eye exam. Pt. is on QHP/ [employer] Cascade Gold, EIP and EHIP coverage. Pt. gave verbal consent to update release of information.    Plan:  1. Assist pt. to engage in medical care.  2. Assist pt. with medical coverage.  3. Support pt. with adherence to ART meds.  4. Resource for vision care.    DOCUMENT COMPOSED BY:   Annye Slay, MSW [phone] on  08/04/2023

## 2023-08-05 ENCOUNTER — Encounter (HOSPITAL_BASED_OUTPATIENT_CLINIC_OR_DEPARTMENT_OTHER): Payer: Self-pay | Admitting: Counselor

## 2023-08-06 NOTE — Progress Notes (Signed)
 DOCUMENT TYPE:    Social Work - Follow-up Note    PATIENT:    Dennis [name], Dennis [name]  MRN:    [id number]  DOB:    11-05-1990    ENCOUNTER DATE:  08/05/2023    ASSOCIATED PROGRAM-CLINIC:  [facility]    CONTACT TYPE:  Collateral    BRIEF DESCRIPTION:  RE: Large medical bill    PATIENT DESCRIPTION, PRESENTING PROBLEM, PATIENT / FAMILY GOALS:  33 year old African American gay male with HIV, CD4 count 551, VL undetectable and on ART meds. Abby established HIV care and case management at [facility]. Pt diagnosed with HIV on 03/21/2014 previous provider Dr. Lamar Bucks at [place], [place]. His family does not know about his diagnosis, but some his friends know. Pt. is on [employer] Cascade Gold, EIP and charity coverage, [employer] for [employer]. PCP Marolyn Cyndee Barter MD.    INTERVENTION / OUTCOME / PLAN:  SWR emailed Amber re: Pt. Guar# [phone] increased from [phone] to [phone]. When last time I called [place] billing I was told they did not bill to primary insurance [employer], instead they billed to EIP. They said they will correct. Would you please check to solve this issue?    Will f/u with pt. acct re: correction    Plan:  1. Assist pt. with large medical bill    DOCUMENT COMPOSED BY:   Annye Slay, MSW [phone] on  08/06/2023

## 2023-08-14 ENCOUNTER — Encounter (HOSPITAL_BASED_OUTPATIENT_CLINIC_OR_DEPARTMENT_OTHER): Payer: Self-pay | Admitting: Counselor

## 2023-08-15 ENCOUNTER — Encounter (HOSPITAL_BASED_OUTPATIENT_CLINIC_OR_DEPARTMENT_OTHER): Payer: Self-pay | Admitting: Counselor

## 2023-08-18 NOTE — Progress Notes (Signed)
 DOCUMENT TYPE:    Social Work - Follow-up Note    PATIENT:    Dennis [name], Dennis [name]  MRN:    [id number]  DOB:    November 25, 1990    ENCOUNTER DATE:  08/15/2023    ASSOCIATED PROGRAM-CLINIC:  [place]    CONTACT TYPE:  Telephone    BRIEF DESCRIPTION:  RE: Medical bill and Regence/ EIP    PATIENT DESCRIPTION, PRESENTING PROBLEM, PATIENT / FAMILY GOALS:  33 year old African American gay male with HIV, CD4 count 551, VL undetectable and on ART meds. Abby established HIV care and case management at [facility]. Pt diagnosed with HIV on 03/21/2014 previous provider Dr. Lamar Bucks at [place], [place]. His family does not know about his diagnosis, but some his friends know. Pt. is on [employer] Cascade Gold, EIP and charity coverage, [employer] for [employer]. PCP Marolyn Cyndee Barter MD.    Re: Large medical bills. EIP will expire 09/14/2023.    INTERVENTION / OUTCOME / PLAN:  SWR called pt. to relate the response from pt. acct and support re: large medical bills and [employer] request. Pt. said he will call [employer]ence and get back to Clinical research associate. Writer reminded pt. about EIP renewal in this month. Pt. agreed to e-mail 2 mos paystubs.    Plan:  1. Assist pt. with medical bills issue.  2. Assist pt. with EIP renewal.    DOCUMENT COMPOSED BY:   Annye Slay, MSW [phone] on  08/18/2023

## 2023-08-18 NOTE — Progress Notes (Signed)
 DOCUMENT TYPE:    Social Work - Full Assessment    PATIENT:    Dennis Dennis [name], Dennis [name]  MRN:    [id number]  DOB:    07-04-1990    ENCOUNTER DATE:  08/14/2023    ASSOCIATED PROGRAM-CLINIC:  [facility]    CONTACT TYPE:  Collateral    BRIEF DESCRIPTION:  Assessment    PATIENT DESCRIPTION (AGE, GENDER, LANGUAGE, APPEARANCE, ETC.):  32 y/o AA gay male with HIV, on HAART meds CD4 count 551, VL undetectable, on HAART meds established HIV care and case management at [facility] clinic. Pt. diagnosed with HIV on 03/21/2014 previous provider Dr. Lamar [name] at [place], [place]. Dennis [name] works full time at [employer]. Pt. lives at the address:[address] [place], [place] [address], DOB Jun 28, 1990  phone# [phone]. Pt. is on [employer] C[employer] Gold, EIP and EHIP coverage.    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    LNOK, EMERGENCY CONTACT-RELATIONSHIP, DPOA, ADVANCED DIRECTIVE:  None    KNOWLEDGE OF HIV DISEASE, PREVENTION AND TRANSMISSION (INCLUDE CURRENT STDS):  Pt. understands about HIV infection and TX.    ADHERENCE TO HIV MEDS AND HIV APPOINTMENTS:  Pt. is taking HAART meds regularly, VL undetectable.    LAST TOBACCO USE (DATE):  None    DENTAL NEEDS:  Dental care services through [facility]    TRANSPORTATION TO CARE:  Pt. drives own car or uses public transportation    INCOME, [place], VETERAN STATUS:  Pt. is working full time at [employer] [phone]. He is on [employer] and EIP coverage.    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    CULTURAL / RELIGIOUS FACTORS, SUPPORT SYSTEM:  None    LIVING ARRANGEMENT:  Pt. lives at the address:    [address]  [place], [place] [address],    MENTAL HEALTH HISTORY (INPATIENT/OUTPATIENT TREATMENT HISTORY, MEDICATIONS, SUICIDAL IDEATION/ATTEMPTS, AGENCIES INVOLVED):  WNL    SUBSTANCE ABUSE (DRUGS OF CHOICE, PATTERN OF USE, INPATIENT/OUTPATIENT TREATMENT HISTORY):  WNL    LEGAL ISSUES: CRIMINAL AND IMMIGRATION:  None    INTERVENTION / PLAN FOR FOLLOW-UP:  Plan:  1. Assist pt. to engage in medical care.  2. Assist pt. with medical coverage.  3. Assist pt. dental care services.  4. Assist pt. with dental care services.  5. Assist pt. medical bills.    DOCUMENT COMPOSED BY:   Annye Slay, MSW [phone] on  08/18/2023

## 2023-08-18 NOTE — Progress Notes (Signed)
 DOCUMENT TYPE:    Social Work - Follow-up Note    PATIENT:    Dennis, [name]  MRN:    [id number]  DOB:    11-19-1990    ENCOUNTER DATE:  08/14/2023    ASSOCIATED PROGRAM-CLINIC:  [facility]    CONTACT TYPE:  Collateral    BRIEF DESCRIPTION:  RE: Medical bill    PATIENT DESCRIPTION, PRESENTING PROBLEM, PATIENT / FAMILY GOALS:  33 year old African American gay male with HIV, CD4 count 551, VL undetectable and on ART meds. Abby established HIV care and case management at [facility]. Pt diagnosed with HIV on 03/21/2014 previous provider Dr. Lamar Bucks at [place], [place]. His family does not know about his diagnosis, but some his friends know. Pt. is on [employer] Cascade Gold, EIP and charity coverage, [employer] for [employer]. PCP Marolyn Cyndee Barter MD.    INTERVENTION / OUTCOME / PLAN:  SWR received response from [employer], [employer] from pt. acct and support services, I am so sorry. I think the representative you spoke with was confused or misinterpreted the information on their account. We attempted to bill [employer] and they denied back on 03/06/23 for needing an incident questionnaire completed by the patient. We sent two letters to the patient's address to let them know. When no response was received, we started to send invoices the patient.  The patient needs to contact [employer] as soon as possible to provide the requested information and ask them to re-process the claim.    Plan:  1. Assist pt. with large medical bill.    DOCUMENT COMPOSED BY:   Annye Slay, MSW [phone] on  08/18/2023

## 2023-08-20 ENCOUNTER — Encounter (HOSPITAL_BASED_OUTPATIENT_CLINIC_OR_DEPARTMENT_OTHER): Payer: Self-pay | Admitting: Counselor

## 2023-08-22 NOTE — Progress Notes (Signed)
 DOCUMENT TYPE:    Social Work - Follow-up Note    PATIENT:    Dennis [name], Dennis [name]  MRN:    [id number]  DOB:    11-15-1990    ENCOUNTER DATE:  08/20/2023    ASSOCIATED PROGRAM-CLINIC:  [place]    CONTACT TYPE:  Email    BRIEF DESCRIPTION:  RE: Paystubs/ Medical bills    PATIENT DESCRIPTION, PRESENTING PROBLEM, PATIENT / FAMILY GOALS:  33 year old African American gay male with HIV, CD4 count 551, VL undetectable and on ART meds. Abby established HIV care and case management at [facility]. Pt diagnosed with HIV on 03/21/2014 previous provider Dr. Lamar Bucks at [place], [place]. His family does not know about his diagnosis, but some his friends know. Pt. is on [employer] Cascade Gold, EIP and charity coverage, [employer] for [employer]. PCP Marolyn Cyndee Barter MD.    SWR e-mailed pt. re: complete paystubs.    INTERVENTION / OUTCOME / PLAN:  SWR communicated with pt. to send 2 mos complete paystubs. Pt. e-mailed paystubs, printed and left on [place] EIP coordinator desk. Writer e-mail [name] to remind pt. to communicate with [employer] re: large bill.    Plan:  1. Assist pt. with medical coverage.    DOCUMENT COMPOSED BY:   Annye Slay, MSW [phone] on  08/22/2023

## 2023-08-26 ENCOUNTER — Other Ambulatory Visit (HOSPITAL_BASED_OUTPATIENT_CLINIC_OR_DEPARTMENT_OTHER): Payer: Self-pay | Admitting: Infectious Disease

## 2023-08-26 ENCOUNTER — Other Ambulatory Visit (HOSPITAL_BASED_OUTPATIENT_CLINIC_OR_DEPARTMENT_OTHER): Payer: Self-pay | Admitting: Unknown Physician Specialty

## 2023-08-26 DIAGNOSIS — F4322 Adjustment disorder with anxiety: Secondary | ICD-10-CM

## 2023-08-26 DIAGNOSIS — B20 Human immunodeficiency virus [HIV] disease: Secondary | ICD-10-CM

## 2023-08-28 MED ORDER — DULOXETINE HCL 60 MG OR CPEP
60.0000 mg | DELAYED_RELEASE_CAPSULE | Freq: Every day | ORAL | 0 refills | Status: DC
Start: 2023-08-28 — End: 2023-10-20

## 2023-08-28 MED ORDER — DESCOVY 200-25 MG OR TABS
1.0000 | ORAL_TABLET | Freq: Every day | ORAL | 0 refills | Status: DC
Start: 2023-08-28 — End: 2023-10-20

## 2023-08-28 MED ORDER — DOLUTEGRAVIR SODIUM 50 MG OR TABS
ORAL_TABLET | ORAL | 0 refills | Status: DC
Start: 2023-08-28 — End: 2023-10-20

## 2023-08-28 NOTE — Telephone Encounter (Signed)
 Patient last seen on 04/07/23 and was to return in 3 monthsa.  One refill (30 days) authorized.  Please schedule follow up visit.

## 2023-09-09 ENCOUNTER — Encounter (HOSPITAL_BASED_OUTPATIENT_CLINIC_OR_DEPARTMENT_OTHER): Payer: Self-pay | Admitting: Counselor

## 2023-09-12 NOTE — Progress Notes (Signed)
 DOCUMENT TYPE:    Social Work - Follow-up Note    PATIENT:    Lucus, Lambertson  MRN:    [id number]  DOB:    [birth year]    ENCOUNTER DATE:  09/10/2023    ASSOCIATED PROGRAM-CLINIC:  [place]    CONTACT TYPE:  Email    BRIEF DESCRIPTION:  RE: unpaid medical bill    PATIENT DESCRIPTION, PRESENTING PROBLEM, PATIENT / FAMILY GOALS:  33 year old African American gay male with HIV, CD4 count 551, VL undetectable and on ART meds. Abby established HIV care and case management at [facility]. Pt diagnosed with HIV on 03/21/2014 previous provider Dr. Lamar Bucks at [place], [place]. His family does not know about his diagnosis, but some his friends know. Pt. is on [employer] Cascade Gold, EIP and charity coverage, [employer] for [employer]. PCP Marolyn Cyndee Barter MD.    INTERVENTION / OUTCOME / PLAN:  SWR sent e-mail reminder re: unpaid medical bill about [phone]. [employer]ence requested to respond their call before paying this bill.    Plan:  1. Assist pt. with medical bills.    DOCUMENT COMPOSED BY:   Annye Slay, MSW [phone] on  09/12/2023

## 2023-09-16 ENCOUNTER — Other Ambulatory Visit (HOSPITAL_BASED_OUTPATIENT_CLINIC_OR_DEPARTMENT_OTHER): Payer: Self-pay | Admitting: Infectious Disease

## 2023-10-20 ENCOUNTER — Other Ambulatory Visit (HOSPITAL_BASED_OUTPATIENT_CLINIC_OR_DEPARTMENT_OTHER): Payer: Self-pay | Admitting: Infectious Disease

## 2023-10-20 ENCOUNTER — Ambulatory Visit: Attending: Infectious Disease | Admitting: Infectious Disease

## 2023-10-20 VITALS — BP 126/83 | HR 56 | Temp 98.8°F | Resp 16 | Ht 72.44 in | Wt 184.0 lb

## 2023-10-20 DIAGNOSIS — F4322 Adjustment disorder with anxiety: Secondary | ICD-10-CM | POA: Insufficient documentation

## 2023-10-20 DIAGNOSIS — Z202 Contact with and (suspected) exposure to infections with a predominantly sexual mode of transmission: Secondary | ICD-10-CM | POA: Insufficient documentation

## 2023-10-20 DIAGNOSIS — D708 Other neutropenia: Secondary | ICD-10-CM

## 2023-10-20 DIAGNOSIS — B20 Human immunodeficiency virus [HIV] disease: Secondary | ICD-10-CM | POA: Insufficient documentation

## 2023-10-20 DIAGNOSIS — F321 Major depressive disorder, single episode, moderate: Secondary | ICD-10-CM | POA: Insufficient documentation

## 2023-10-20 LAB — CBC, DIFF
% Basophils: 0 %
% Eosinophils: 2 %
% Immature Granulocytes: 0 %
% Lymphocytes: 57 %
% Monocytes: 10 %
% Neutrophils: 31 %
% Nucleated RBC: 0 %
Absolute Eosinophil Count: 0.06 10*3/uL (ref 0.00–0.50)
Absolute Lymphocyte Count: 2.16 10*3/uL (ref 1.00–4.80)
Basophils: 0.01 10*3/uL (ref 0.00–0.20)
Hematocrit: 38 % (ref 38.0–50.0)
Hemoglobin: 13.2 g/dL (ref 13.0–18.0)
Immature Granulocytes: 0.01 10*3/uL (ref 0.00–0.05)
MCH: 29.2 pg (ref 27.3–33.6)
MCHC: 34.8 g/dL (ref 32.2–36.5)
MCV: 84 fL (ref 81–98)
Monocytes: 0.36 10*3/uL (ref 0.00–0.80)
Neutrophils: 1.18 10*3/uL — ABNORMAL LOW (ref 1.80–7.00)
Nucleated RBC: 0 10*3/uL
Platelet Count: 191 10*3/uL (ref 150–400)
RBC: 4.52 10*6/uL (ref 4.40–5.60)
RDW-CV: 13.2 % (ref 11.0–14.5)
WBC: 3.78 10*3/uL — ABNORMAL LOW (ref 4.3–10.0)

## 2023-10-20 LAB — BASIC METABOLIC PANEL
Anion Gap: 6 (ref 4–12)
Calcium: 9.3 mg/dL (ref 8.9–10.2)
Carbon Dioxide, Total: 30 meq/L (ref 22–32)
Chloride: 105 meq/L (ref 98–108)
Creatinine: 1.27 mg/dL — ABNORMAL HIGH (ref 0.51–1.18)
Glucose: 96 mg/dL (ref 62–125)
Potassium: 4.1 meq/L (ref 3.6–5.2)
Sodium: 141 meq/L (ref 135–145)
Urea Nitrogen: 9 mg/dL (ref 8–21)
eGFR by CKD-EPI 2021: >60 mL/min/1.73_m2 (ref >59)

## 2023-10-20 MED ORDER — DOLUTEGRAVIR SODIUM 50 MG OR TABS
ORAL_TABLET | ORAL | 5 refills | Status: AC
Start: 2023-10-20 — End: ?

## 2023-10-20 MED ORDER — DOXYCYCLINE MONOHYDRATE 100 MG OR CAPS
ORAL_CAPSULE | ORAL | 1 refills | Status: AC
Start: 2023-10-20 — End: ?

## 2023-10-20 MED ORDER — DESCOVY 200-25 MG OR TABS
1.0000 | ORAL_TABLET | Freq: Every day | ORAL | 5 refills | Status: AC
Start: 2023-10-20 — End: ?

## 2023-10-20 MED ORDER — DULOXETINE HCL 60 MG OR CPEP: 60.0000 mg | DELAYED_RELEASE_CAPSULE | Freq: Every day | ORAL | 2 refills | Status: DC

## 2023-10-20 NOTE — Progress Notes (Signed)
 Dennis [name] CLINIC  FOLLOW-UP VISIT  10/20/2023    ID/CC: Dennis [name] is a 33 year old man with well-controlled HIV here for check-in.     ASSESSMENT/PLAN:   1. HIV disease (HCC) (Primary)  Longstanding excellent adherence to DTG + F/TAF, last CD4 >500 in 10/2020, VL UD today. Has standing orders for q3mo monitoring per his request.    - Hepatitis B Surface Antibody; Future  - HIV1 Rna Quantitation; Future  - GC & CT Nucleic Acid Detection - Urine; Standing  - GC & CT Nucleic Acid Detection - Rectal; Standing  - GC & CT Nucleic Acid Detection - Throat; Standing  - RPR Quantitative (Sendout); Standing  - CBC with Diff; Future  - Basic Metabolic Panel; Future  - dolutegravir  (Tivicay ) 50 MG tablet; Take 1 tablet  by mouth daily  Dispense: 30 tablet; Refill: 5  - emtricitabine -tenofovir  alafenamide (Descovy ) 200-25 MG tablet; Take 1 tablet by mouth daily.  Dispense: 30 tablet; Refill: 5  - T Cell Subsets - CD4 & CD8 Only; Future    2. Other neutropenia  Monitoring over time - chronic, asymptomatic.   - CBC with Diff; Future    3. Adjustment disorder with anxiety  4. Current moderate episode of major depressive disorder without prior episode (HCC)  Mood significantly down compared to last visit. PHQ-2 is 6 - given chronicity meets criteria for MDD. Talked about resuming duloxetine  that worked well for him in the past. I floated idea of starting with lower dose and tapering up as we did last time, but he preferred to start again at 60 mg qd. CTM for sx and response  - DULoxetine  60 MG DR capsule; Take 1 capsule (60 mg) by mouth daily.  Dispense: 30 capsule; Refill: 2    5. Contact with and (suspected) exposure to infections with a predominantly sexual mode of transmission  Pt open to initiating. May not use it much but agrees it's good to have on hand just in case.   - doxycycline  monohydrate 100 MG capsule; Take 2 capsules (200 mg) as soon as possible within 24 hours (but no later than 72 hours) after condomless sex.  Do not exceed 200 mg/day.  Dispense: 30 capsule; Refill: 1    Return to clinic/follow-up: 3-6 months    I spent a total of 39 minutes for the patient's care on the date of the service.        _____________________________  HISTORY OF PRESENT ILLNESS:   Dennis [name] is just feeling ok. Work remains stressful. More than anything has been feeling in a funk, anhedonic, irritable with everything. Others have commented about his demeanor as well. Sleeping ok, feels relatively refreshed on awakening but still wants to sleep longer. Low motivation to go to work or do anything on his days off. Can't identify anything that he is excited about or enjoys right now.  Was taking duloxetine  up until about 3 weeks ago. Didn't think it was really doing much while he was on it, but since he stopped has realized the effects of it not being in his system.     Wants to start lion's mane and oil of oregano - curious if these will cause an interaction with ART. Wants to get updated T cell count.    Open to doing STI testing today. Interested in doxy-PEP    Over the last 2 weeks, how often have you been bothered by the following problems?  0=Not at all  1=Several days  2=More than  half the days  3=Nearly every day    1. Little interest or pleasure in doing things 3  2. Feeling down, depressed, or hopeless 3    = Total Score 6    If the PHQ2 score is 3 or greater, consider depression.    PHYSICAL EXAMINATION:   Vital Signs: BP 126/83   Pulse (!) 56   Temp 37.1 C   Resp 16   Ht 6' 0.44 (1.84 m)   Wt 83.5 kg (184 lb)   SpO2 99%   BMI 24.65 kg/m   Constitutional: Well-appearing, a bit morose   Skin: Dry and warm to touch. No visible rashes   Neuro: A&Ox3, no focal deficits  Psych: Ok, restricted affect, speaking in low voice. No SI/HI     HIV General Healthcare Maintenance  Cardiovascular/metabolic   Lipids: nl in 07/2022  BP: nl screens at last 4 visits  Glucose control: A1c nl 07/2022  UA: microhematuria in 07/2017 --> resolved on repeat UA  10/2017. Trace proteinuria 01/2019 and 12/2022 (potentially due to exercise)     Sexual health and STI screening  Syphilis: serofast RPR 1:4 in 02/2017 after tx for early syphilis in 01/2016 (initial RPR 1:32) --> 1:1 in 10/2017 (likely new serofast titer) --> 1:1 in 03/2018, 08/2018, 01/2019, 08/2019 --> 1:4 in 01/2020 (treated for EL syphilis with BIC x1) --> NR in 03/2023  GC/CT: Last screen neg x3 sites in 03/2023  Doxy-PEP: rectal GC in 12/2022 - offered today  Sexual pleasure: MSM, vers. Able to derive pleasure from sex, although sex has never been the focus of his relationships. No ED or libido changes associated with duloxetine  use.   HPV status: declined series  HSV status: no known outbreaks     Cancer screening  Lung: N/A (never tobacco user)  Anal: DARE alone at age 66     Immunizations/Serologies and misc testing  Declines flu, COVID bivalent booster, Menveo, TDaP and HPV vaccines   Quantiferon neg 01/2016  Toxo neg 01/2016  HAV immune 01/2016  HBsAb pos (titer 82), sAg neg, cAb neg 01/2016  HCV NR 03/2023    Immunization History   Administered Date(s) Administered    COVID-19 Pfizer mRNA monovalent 12 yrs and older 01/28/2019, 02/17/2019    COVID-19 Pfizer mRNA monovalent tris-sucrose 12 yrs and older 06/21/2020    Influenza quadrivalent PF 02/08/2016, 10/10/2016    Mpox/smallpox (vaccinia) live 09/03/2020, 10/07/2020    Pneumococcal conjugate PCV13 (Prevnar 13) 05/02/2016    Pneumococcal polysaccharide PPSV23 (Pneumovax 23) 10/10/2016

## 2023-10-21 LAB — T CELL SUBSETS CD4/CD8 ONLY COUNTS
% CD4: 36 % (ref 33–61)
% CD8: 29 % (ref 14–35)
Abs CD4 Lymphocyte Cnt: 0.703 10*3/uL — ABNORMAL LOW (ref 0.730–2.250)
Abs CD8 Lymphocyte Cnt: 0.564 10*3/uL (ref 0.250–1.240)
CD4/CD8 Ratio: 1.25 (ref 1.00–3.78)

## 2023-10-21 LAB — HEPATITIS B SURFACE AB
Hepatitis B Surface Antibody Intl Units: 128.82 m[IU]/mL
Hepatitis B Surface Antibody: REACTIVE

## 2023-10-21 LAB — HIV1 RNA QUANTITATION: HIV RNA Result: NOT DETECTED {copies}/mL

## 2023-10-22 LAB — GC&CHLAM NUCLEIC ACID DETECTN

## 2023-10-23 LAB — RPR QUANTITATIVE (SENDOUT): RPR Quantitative (Sendout): NONREACTIVE

## 2023-11-23 ENCOUNTER — Encounter (HOSPITAL_BASED_OUTPATIENT_CLINIC_OR_DEPARTMENT_OTHER): Payer: Self-pay | Admitting: Infectious Disease

## 2023-11-26 ENCOUNTER — Encounter (HOSPITAL_BASED_OUTPATIENT_CLINIC_OR_DEPARTMENT_OTHER): Payer: Self-pay | Admitting: Counselor

## 2023-11-27 NOTE — Progress Notes (Signed)
 DOCUMENT TYPE:    Social Work - Follow-up Note    PATIENT:    Alexzavier, Girardin  MRN:    [id number]  DOB:    1990-11-22    ENCOUNTER DATE:  11/26/2023    ASSOCIATED PROGRAM-CLINIC:  [place]    CONTACT TYPE:  Telephone    BRIEF DESCRIPTION:  2026 QHP/ WHPF/ EHIP/ EIP/ Service plan/ Adherence    PATIENT DESCRIPTION, PRESENTING PROBLEM, PATIENT / FAMILY GOALS:  33 year old African American gay male with HIV, CD4 count 551, VL undetectable and on ART meds. Abby established HIV care and case management at [facility]. Pt diagnosed with HIV on 03/21/2014 previous provider Dr. Lamar Bucks at [place], [place]. His family does not know about his diagnosis, but some his friends know. Pt. is on [employer] Cascade Gold, EIP and AMER[employer]ILY [employer]URANCE for premium payment. PCP Marolyn Cyndee Barter MD.    INTERVENTION / OUTCOME / PLAN:  SWR discussed with pt. about 2026 QHP enrollment and changes in [employer]ence Cascade coverage. Pt. works the same job and lives at the same address. Pt. income increased and reported income change in [facility] application. Pt. selected [place] [employer] for 2026 coverage. Pt. gave verbal consent to complete EHIP. Plan selection for 2026 QHP will be selected after 11/15.    Went over service plan with pt. and completed. Pt. is taking ART meds regularly. Pt. lives in stable house and works full time at [employer]. Pt. attends medical care and actively engages with [facility]. Pt. struggles with depression and discussed with PCP. Pt. uses [facility] for dental coverage. Updated release of information with verbal consent.    Plan:  1. Assist pt. to engage in medical care.  2. Assist pt. with medical coverage.  3. Support with premium payment.  4. Support pt. with adherence to ART meds.    DOCUMENT COMPOSED BY:   Annye Slay, MSW [phone] on  11/27/2023

## 2023-12-01 ENCOUNTER — Encounter (HOSPITAL_BASED_OUTPATIENT_CLINIC_OR_DEPARTMENT_OTHER): Payer: Self-pay | Admitting: Counselor

## 2023-12-02 ENCOUNTER — Encounter (HOSPITAL_BASED_OUTPATIENT_CLINIC_OR_DEPARTMENT_OTHER): Payer: Self-pay | Admitting: Counselor

## 2023-12-02 NOTE — Progress Notes (Signed)
 DOCUMENT TYPE:    Social Work - Follow-up Note    PATIENT:    Vivian, Okelley  MRN:    [id number]  DOB:    10/03/1990    ENCOUNTER DATE:  12/01/2023    ASSOCIATED PROGRAM-CLINIC:  [place]    CONTACT TYPE:  Collateral    BRIEF DESCRIPTION:  [employer]/ Enrolled [facility] complete    PATIENT DESCRIPTION, PRESENTING PROBLEM, PATIENT / FAMILY GOALS:  33 year old African American gay male with HIV, CD4 count 551, VL undetectable and on ART meds. Abby established HIV care and case management at [facility]. Pt diagnosed with HIV on 03/21/2014 previous provider Dr. Lamar Bucks at [place], [place]. His family does not know about his diagnosis, but some his friends know. Pt. is on [employer], EIP and charity coverage, [employer]ANCE for [employer]. PCP Marolyn Cyndee Barter MD.    INTERVENTION / OUTCOME / PLAN:  SWR assisted pt. to enroll to 2026 QHP with pt's selection [employer]. No tax credit, Premium with savings $584.66.    Plan:  1. Assist pt. with 2026 medical coverage.    DOCUMENT COMPOSED BY:   Annye Slay, MSW [phone] on  12/02/2023

## 2023-12-03 NOTE — Progress Notes (Signed)
 DOCUMENT TYPE:    Social Work - Follow-up Note    PATIENT:    Dennis, [name]  MRN:    [id number]  DOB:    1990/08/04    ENCOUNTER DATE:  12/02/2023    ASSOCIATED PROGRAM-CLINIC:  [place]    CONTACT TYPE:  Collateral    BRIEF DESCRIPTION:  EHIP application/EHIP attestation/ Final plan/ Data form submitted    PATIENT DESCRIPTION, PRESENTING PROBLEM, PATIENT / FAMILY GOALS:  33 year old African American gay male with HIV, CD4 count 551, VL undetectable and on ART meds. Abby established HIV care and case management at [facility]. Pt diagnosed with HIV on 03/21/2014 previous provider Dr. Lamar Bucks at [place], [place]. His family does not know about his diagnosis, but some his friends know. Pt. is on [employer], EIP and charity coverage, [employer]ILY [employer]ANCE for [employer]. PCP Marolyn Cyndee Barter MD.    INTERVENTION / OUTCOME / PLAN:  SWR assisted to complete EHIP application for new insurance premium coverage, EHIP attestation for verbal consent completed, printed final plan selection and submitted with Data form.    Plan:  1. Assist pt. with medical coverage.  2. Assist pt. with premium payment.    DOCUMENT COMPOSED BY:   Annye Slay, MSW [phone] on  12/03/2023

## 2024-01-29 ENCOUNTER — Other Ambulatory Visit (HOSPITAL_BASED_OUTPATIENT_CLINIC_OR_DEPARTMENT_OTHER): Payer: Self-pay | Admitting: Infectious Disease

## 2024-01-29 DIAGNOSIS — F321 Major depressive disorder, single episode, moderate: Secondary | ICD-10-CM

## 2024-01-29 DIAGNOSIS — F4322 Adjustment disorder with anxiety: Secondary | ICD-10-CM

## 2024-01-29 MED ORDER — DULOXETINE HCL 60 MG OR CPEP: 60.0000 mg | DELAYED_RELEASE_CAPSULE | Freq: Every day | ORAL | 2 refills | Status: AC
